# Patient Record
Sex: Female | Born: 1995 | Race: White | Hispanic: No | Marital: Single | Smoking: Never smoker
Health system: Southern US, Community
[De-identification: ages and names within clinical notes are randomized; demographics above are authoritative.]

## PROBLEM LIST (undated history)

## (undated) DIAGNOSIS — S0232XA Fracture of orbital floor, left side, initial encounter for closed fracture: Secondary | ICD-10-CM

## (undated) DIAGNOSIS — R001 Bradycardia, unspecified: Secondary | ICD-10-CM

## (undated) DIAGNOSIS — L709 Acne, unspecified: Secondary | ICD-10-CM

## (undated) DIAGNOSIS — R42 Dizziness and giddiness: Principal | ICD-10-CM

## (undated) DIAGNOSIS — S022XXA Fracture of nasal bones, initial encounter for closed fracture: Secondary | ICD-10-CM

## (undated) DIAGNOSIS — F909 Attention-deficit hyperactivity disorder, unspecified type: Secondary | ICD-10-CM

## (undated) HISTORY — DX: Dizziness and giddiness: R42

## (undated) HISTORY — DX: Bradycardia, unspecified: R00.1

---
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
## 2013-08-26 DIAGNOSIS — S022XXA Fracture of nasal bones, initial encounter for closed fracture: Secondary | ICD-10-CM

## 2013-08-26 DIAGNOSIS — S0232XA Fracture of orbital floor, left side, initial encounter for closed fracture: Secondary | ICD-10-CM

## 2013-08-26 HISTORY — DX: Fracture of orbital floor, left side, initial encounter for closed fracture: S02.32XA

## 2013-08-26 HISTORY — DX: Fracture of nasal bones, initial encounter for closed fracture: S02.2XXA

## 2013-08-27 ENCOUNTER — Emergency Department (HOSPITAL_COMMUNITY): Payer: Managed Care, Other (non HMO)

## 2013-08-27 ENCOUNTER — Emergency Department (HOSPITAL_COMMUNITY)
Admission: EM | Admit: 2013-08-27 | Discharge: 2013-08-27 | Disposition: A | Discharge status: Home / Self Care | Payer: Managed Care, Other (non HMO) | Attending: Emergency Medicine | Admitting: Emergency Medicine

## 2013-08-27 ENCOUNTER — Encounter (HOSPITAL_COMMUNITY): Payer: Self-pay | Admitting: Emergency Medicine

## 2013-08-27 DIAGNOSIS — S060XAA Concussion with loss of consciousness status unknown, initial encounter: Secondary | ICD-10-CM

## 2013-08-27 DIAGNOSIS — S0230XA Fracture of orbital floor, unspecified side, initial encounter for closed fracture: Secondary | ICD-10-CM | POA: Insufficient documentation

## 2013-08-27 DIAGNOSIS — Y92410 Unspecified street and highway as the place of occurrence of the external cause: Secondary | ICD-10-CM | POA: Insufficient documentation

## 2013-08-27 DIAGNOSIS — Y9389 Activity, other specified: Secondary | ICD-10-CM | POA: Diagnosis not present

## 2013-08-27 DIAGNOSIS — IMO0002 Reserved for concepts with insufficient information to code with codable children: Secondary | ICD-10-CM | POA: Insufficient documentation

## 2013-08-27 DIAGNOSIS — Z79899 Other long term (current) drug therapy: Secondary | ICD-10-CM | POA: Diagnosis not present

## 2013-08-27 DIAGNOSIS — Z791 Long term (current) use of non-steroidal anti-inflammatories (NSAID): Secondary | ICD-10-CM | POA: Insufficient documentation

## 2013-08-27 DIAGNOSIS — S060X0A Concussion without loss of consciousness, initial encounter: Secondary | ICD-10-CM | POA: Diagnosis not present

## 2013-08-27 DIAGNOSIS — Z3202 Encounter for pregnancy test, result negative: Secondary | ICD-10-CM | POA: Diagnosis not present

## 2013-08-27 DIAGNOSIS — S022XXA Fracture of nasal bones, initial encounter for closed fracture: Secondary | ICD-10-CM

## 2013-08-27 DIAGNOSIS — Z792 Long term (current) use of antibiotics: Secondary | ICD-10-CM | POA: Diagnosis not present

## 2013-08-27 DIAGNOSIS — S0232XA Fracture of orbital floor, left side, initial encounter for closed fracture: Secondary | ICD-10-CM

## 2013-08-27 DIAGNOSIS — S060X9A Concussion with loss of consciousness of unspecified duration, initial encounter: Secondary | ICD-10-CM

## 2013-08-27 DIAGNOSIS — S0990XA Unspecified injury of head, initial encounter: Secondary | ICD-10-CM | POA: Diagnosis present

## 2013-08-27 LAB — POC URINE PREG, ED: PREG TEST UR: NEGATIVE

## 2013-08-27 MED ORDER — IBUPROFEN 200 MG PO TABS
600.0000 mg | ORAL_TABLET | Freq: Once | ORAL | Status: AC
  Administered 2013-08-27: 600 mg via ORAL
  Filled 2013-08-27: qty 3

## 2013-08-27 MED ORDER — HYDROCODONE-ACETAMINOPHEN 5-325 MG PO TABS
2.0000 | ORAL_TABLET | Freq: Once | ORAL | Status: AC
  Administered 2013-08-27: 2 via ORAL
  Filled 2013-08-27: qty 2

## 2013-08-27 MED ORDER — CEFTRIAXONE SODIUM 1 G IJ SOLR
500.0000 mg | Freq: Once | INTRAMUSCULAR | Status: AC
  Administered 2013-08-27: 500 mg via INTRAMUSCULAR
  Filled 2013-08-27: qty 10

## 2013-08-27 MED ORDER — HYDROMORPHONE HCL PF 1 MG/ML IJ SOLN
1.0000 mg | Freq: Once | INTRAMUSCULAR | Status: AC
Start: 2013-08-27 — End: 2013-08-27
  Administered 2013-08-27: 1 mg via INTRAMUSCULAR
  Filled 2013-08-27: qty 1

## 2013-08-27 MED ORDER — CEPHALEXIN 500 MG PO CAPS: 500.0000 mg | ORAL_CAPSULE | Freq: Four times a day (QID) | ORAL | Status: DC

## 2013-08-27 MED ORDER — DEXAMETHASONE SODIUM PHOSPHATE 10 MG/ML IJ SOLN
10.0000 mg | Freq: Once | INTRAMUSCULAR | Status: AC
  Administered 2013-08-27: 10 mg via INTRAMUSCULAR
  Filled 2013-08-27: qty 1

## 2013-08-27 MED ORDER — IBUPROFEN 600 MG PO TABS: 600.0000 mg | ORAL_TABLET | Freq: Four times a day (QID) | ORAL | Status: DC | PRN

## 2013-08-27 MED ORDER — HYDROCODONE-ACETAMINOPHEN 5-325 MG PO TABS: 1.0000 | ORAL_TABLET | Freq: Four times a day (QID) | ORAL | Status: DC | PRN

## 2013-08-27 MED ORDER — METHYLPREDNISOLONE (PAK) 4 MG PO TABS: ORAL_TABLET | ORAL | Status: DC
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
## 2013-09-04 ENCOUNTER — Encounter (HOSPITAL_BASED_OUTPATIENT_CLINIC_OR_DEPARTMENT_OTHER): Payer: Self-pay | Admitting: *Deleted

## 2013-09-07 ENCOUNTER — Other Ambulatory Visit (HOSPITAL_BASED_OUTPATIENT_CLINIC_OR_DEPARTMENT_OTHER): Payer: Self-pay | Admitting: Otolaryngology
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
## 2013-09-10 ENCOUNTER — Encounter (HOSPITAL_BASED_OUTPATIENT_CLINIC_OR_DEPARTMENT_OTHER): Payer: Self-pay | Admitting: *Deleted

## 2013-09-10 ENCOUNTER — Encounter (HOSPITAL_BASED_OUTPATIENT_CLINIC_OR_DEPARTMENT_OTHER): Payer: Managed Care, Other (non HMO) | Admitting: Anesthesiology

## 2013-09-10 ENCOUNTER — Ambulatory Visit (HOSPITAL_BASED_OUTPATIENT_CLINIC_OR_DEPARTMENT_OTHER)
Admission: RE | Admit: 2013-09-10 | Discharge: 2013-09-11 | Disposition: A | Discharge status: Home / Self Care | Payer: Managed Care, Other (non HMO) | Source: Ambulatory Visit | Attending: Otolaryngology | Admitting: Otolaryngology

## 2013-09-10 ENCOUNTER — Ambulatory Visit (HOSPITAL_BASED_OUTPATIENT_CLINIC_OR_DEPARTMENT_OTHER): Payer: Managed Care, Other (non HMO) | Admitting: Anesthesiology

## 2013-09-10 ENCOUNTER — Encounter (HOSPITAL_BASED_OUTPATIENT_CLINIC_OR_DEPARTMENT_OTHER)
Admission: RE | Disposition: A | Discharge status: Home / Self Care | Payer: Self-pay | Source: Ambulatory Visit | Attending: Otolaryngology

## 2013-09-10 DIAGNOSIS — F909 Attention-deficit hyperactivity disorder, unspecified type: Secondary | ICD-10-CM | POA: Insufficient documentation

## 2013-09-10 DIAGNOSIS — S0230XA Fracture of orbital floor, unspecified side, initial encounter for closed fracture: Secondary | ICD-10-CM | POA: Diagnosis present

## 2013-09-10 DIAGNOSIS — S022XXA Fracture of nasal bones, initial encounter for closed fracture: Secondary | ICD-10-CM | POA: Insufficient documentation

## 2013-09-10 DIAGNOSIS — Y92410 Unspecified street and highway as the place of occurrence of the external cause: Secondary | ICD-10-CM | POA: Insufficient documentation

## 2013-09-10 HISTORY — PX: ORIF ORBITAL FRACTURE: SHX5312

## 2013-09-10 HISTORY — DX: Acne, unspecified: L70.9

## 2013-09-10 HISTORY — DX: Fracture of nasal bones, initial encounter for closed fracture: S02.2XXA

## 2013-09-10 HISTORY — DX: Fracture of orbital floor, left side, initial encounter for closed fracture: S02.32XA

## 2013-09-10 HISTORY — PX: CLOSED REDUCTION NASAL FRACTURE: SHX5365

## 2013-09-10 HISTORY — DX: Attention-deficit hyperactivity disorder, unspecified type: F90.9

## 2013-09-10 SURGERY — OPEN REDUCTION INTERNAL FIXATION (ORIF) ORBITAL FRACTURE
Anesthesia: General | Site: Nose

## 2013-09-10 MED ORDER — SUCCINYLCHOLINE CHLORIDE 20 MG/ML IJ SOLN
INTRAMUSCULAR | Status: AC
  Filled 2013-09-10: qty 1

## 2013-09-10 MED ORDER — PROPOFOL 10 MG/ML IV BOLUS
INTRAVENOUS | Status: DC | PRN
  Administered 2013-09-10: 200 mg via INTRAVENOUS

## 2013-09-10 MED ORDER — OXYCODONE HCL 5 MG/5ML PO SOLN: 5.0000 mg | Freq: Once | ORAL | Status: AC | PRN

## 2013-09-10 MED ORDER — HYDROMORPHONE HCL PF 1 MG/ML IJ SOLN
INTRAMUSCULAR | Status: AC
  Filled 2013-09-10: qty 1

## 2013-09-10 MED ORDER — MORPHINE SULFATE 2 MG/ML IJ SOLN
2.0000 mg | INTRAMUSCULAR | Status: DC | PRN
  Administered 2013-09-10: 4 mg via INTRAVENOUS
  Administered 2013-09-10: 2 mg via INTRAVENOUS
  Administered 2013-09-10 (×2): 4 mg via INTRAVENOUS
  Administered 2013-09-11: 2 mg via INTRAVENOUS
  Filled 2013-09-10 (×4): qty 2

## 2013-09-10 MED ORDER — CIPROFLOXACIN-DEXAMETHASONE 0.3-0.1 % OT SUSP
OTIC | Status: AC
  Filled 2013-09-10: qty 7.5

## 2013-09-10 MED ORDER — NEOMYCIN-POLYMYXIN-DEXAMETH 3.5-10000-0.1 OP OINT
TOPICAL_OINTMENT | Freq: Four times a day (QID) | OPHTHALMIC | Status: DC
  Administered 2013-09-10 – 2013-09-11 (×3): via OPHTHALMIC

## 2013-09-10 MED ORDER — HYDROCODONE-ACETAMINOPHEN 5-325 MG PO TABS
1.0000 | ORAL_TABLET | Freq: Four times a day (QID) | ORAL | Status: DC | PRN
  Filled 2013-09-10 (×3): qty 2

## 2013-09-10 MED ORDER — ONDANSETRON HCL 4 MG/2ML IJ SOLN
4.0000 mg | Freq: Once | INTRAMUSCULAR | Status: AC | PRN
  Filled 2013-09-10: qty 2

## 2013-09-10 MED ORDER — OXYMETAZOLINE HCL 0.05 % NA SOLN
NASAL | Status: AC
  Filled 2013-09-10: qty 15

## 2013-09-10 MED ORDER — DEXTROSE-NACL 5-0.45 % IV SOLN
INTRAVENOUS | Status: DC
  Administered 2013-09-10 – 2013-09-11 (×3): via INTRAVENOUS

## 2013-09-10 MED ORDER — MIDAZOLAM HCL 5 MG/5ML IJ SOLN
INTRAMUSCULAR | Status: DC | PRN
  Administered 2013-09-10: 2 mg via INTRAVENOUS

## 2013-09-10 MED ORDER — PROMETHAZINE HCL 25 MG RE SUPP
25.0000 mg | Freq: Four times a day (QID) | RECTAL | Status: DC | PRN
Start: 2013-09-10 — End: 2013-09-11
  Administered 2013-09-10: 25 mg via RECTAL
  Filled 2013-09-10: qty 1

## 2013-09-10 MED ORDER — FENTANYL CITRATE 0.05 MG/ML IJ SOLN
INTRAMUSCULAR | Status: AC
Start: 2013-09-10 — End: 2013-09-10
  Filled 2013-09-10: qty 6

## 2013-09-10 MED ORDER — NEOMYCIN-POLYMYXIN-DEXAMETH 3.5-10000-0.1 OP OINT
TOPICAL_OINTMENT | OPHTHALMIC | Status: DC | PRN
  Administered 2013-09-10: 1 via OPHTHALMIC

## 2013-09-10 MED ORDER — HYDROCORTISONE NA SUCCINATE PF 100 MG IJ SOLR
100.0000 mg | Freq: Once | INTRAMUSCULAR | Status: AC
  Administered 2013-09-10: 1 g via INTRAVENOUS

## 2013-09-10 MED ORDER — HYDROCODONE-ACETAMINOPHEN 5-325 MG PO TABS
1.0000 | ORAL_TABLET | ORAL | Status: DC | PRN
  Administered 2013-09-10 – 2013-09-11 (×5): 2 via ORAL
  Filled 2013-09-10 (×2): qty 2

## 2013-09-10 MED ORDER — LIDOCAINE-EPINEPHRINE 1 %-1:100000 IJ SOLN
INTRAMUSCULAR | Status: DC | PRN
  Administered 2013-09-10: 7 mL

## 2013-09-10 MED ORDER — ONDANSETRON HCL 4 MG PO TABS: 4.0000 mg | ORAL_TABLET | ORAL | Status: DC | PRN

## 2013-09-10 MED ORDER — PROPOFOL 10 MG/ML IV BOLUS
INTRAVENOUS | Status: AC
  Filled 2013-09-10: qty 20

## 2013-09-10 MED ORDER — AMPHETAMINE-DEXTROAMPHETAMINE 10 MG PO TABS: 10.0000 mg | ORAL_TABLET | Freq: Every day | ORAL | Status: DC

## 2013-09-10 MED ORDER — CEFAZOLIN SODIUM-DEXTROSE 2-3 GM-% IV SOLR
INTRAVENOUS | Status: AC
  Filled 2013-09-10: qty 50

## 2013-09-10 MED ORDER — ONDANSETRON HCL 4 MG/2ML IJ SOLN
INTRAMUSCULAR | Status: DC | PRN
  Administered 2013-09-10: 4 mg via INTRAVENOUS

## 2013-09-10 MED ORDER — MIDAZOLAM HCL 2 MG/2ML IJ SOLN
INTRAMUSCULAR | Status: AC
  Filled 2013-09-10: qty 2

## 2013-09-10 MED ORDER — LIDOCAINE HCL (CARDIAC) 20 MG/ML IV SOLN
INTRAVENOUS | Status: DC | PRN
  Administered 2013-09-10: 50 mg via INTRAVENOUS

## 2013-09-10 MED ORDER — BSS IO SOLN
INTRAOCULAR | Status: DC | PRN
  Administered 2013-09-10: 15 mL via INTRAOCULAR

## 2013-09-10 MED ORDER — IBUPROFEN 400 MG PO TABS
400.0000 mg | ORAL_TABLET | Freq: Four times a day (QID) | ORAL | Status: DC | PRN
  Administered 2013-09-10 – 2013-09-11 (×2): 400 mg via ORAL
  Filled 2013-09-10 (×2): qty 2

## 2013-09-10 MED ORDER — MIDAZOLAM HCL 2 MG/2ML IJ SOLN: 1.0000 mg | INTRAMUSCULAR | Status: DC | PRN

## 2013-09-10 MED ORDER — LIDOCAINE-EPINEPHRINE 1 %-1:100000 IJ SOLN
INTRAMUSCULAR | Status: AC
  Filled 2013-09-10: qty 1

## 2013-09-10 MED ORDER — FENTANYL CITRATE 0.05 MG/ML IJ SOLN
INTRAMUSCULAR | Status: DC | PRN
  Administered 2013-09-10 (×2): 25 ug via INTRAVENOUS
  Administered 2013-09-10: 50 ug via INTRAVENOUS
  Administered 2013-09-10: 100 ug via INTRAVENOUS

## 2013-09-10 MED ORDER — CEFAZOLIN SODIUM-DEXTROSE 2-3 GM-% IV SOLR
2.0000 g | INTRAVENOUS | Status: AC
  Administered 2013-09-10: 2 g via INTRAVENOUS

## 2013-09-10 MED ORDER — FENTANYL CITRATE 0.05 MG/ML IJ SOLN: 50.0000 ug | INTRAMUSCULAR | Status: DC | PRN

## 2013-09-10 MED ORDER — PROMETHAZINE HCL 25 MG/ML IJ SOLN: 25.0000 mg | Freq: Four times a day (QID) | INTRAMUSCULAR | Status: DC | PRN

## 2013-09-10 MED ORDER — CEPHALEXIN 500 MG PO CAPS
500.0000 mg | ORAL_CAPSULE | Freq: Four times a day (QID) | ORAL | Status: DC
  Administered 2013-09-10 – 2013-09-11 (×4): 500 mg via ORAL

## 2013-09-10 MED ORDER — ONDANSETRON HCL 4 MG/2ML IJ SOLN
4.0000 mg | INTRAMUSCULAR | Status: DC | PRN
Start: 2013-09-10 — End: 2013-09-11
  Administered 2013-09-10: 4 mg via INTRAVENOUS

## 2013-09-10 MED ORDER — MIDAZOLAM HCL 2 MG/ML PO SYRP
12.0000 mg | ORAL_SOLUTION | Freq: Once | ORAL | Status: DC | PRN
Start: 2013-09-10 — End: 2013-09-10

## 2013-09-10 MED ORDER — LACTATED RINGERS IV SOLN
INTRAVENOUS | Status: DC
  Administered 2013-09-10 (×2): via INTRAVENOUS

## 2013-09-10 MED ORDER — NEOMYCIN-POLYMYXIN-DEXAMETH 3.5-10000-0.1 OP OINT
TOPICAL_OINTMENT | OPHTHALMIC | Status: AC
  Filled 2013-09-10: qty 3.5

## 2013-09-10 MED ORDER — OXYCODONE HCL 5 MG PO TABS: 5.0000 mg | ORAL_TABLET | Freq: Once | ORAL | Status: AC | PRN

## 2013-09-10 MED ORDER — BACITRACIN-NEOMYCIN-POLYMYXIN 400-5-5000 EX OINT
TOPICAL_OINTMENT | CUTANEOUS | Status: AC
  Filled 2013-09-10: qty 1

## 2013-09-10 MED ORDER — SUCCINYLCHOLINE CHLORIDE 20 MG/ML IJ SOLN
INTRAMUSCULAR | Status: DC | PRN
  Administered 2013-09-10: 100 mg via INTRAVENOUS

## 2013-09-10 MED ORDER — BSS IO SOLN
INTRAOCULAR | Status: AC
  Filled 2013-09-10: qty 15

## 2013-09-10 MED ORDER — HYDROMORPHONE HCL PF 1 MG/ML IJ SOLN
0.2500 mg | INTRAMUSCULAR | Status: DC | PRN
  Administered 2013-09-10 (×3): 0.5 mg via INTRAVENOUS

## 2013-09-10 MED ORDER — OXYMETAZOLINE HCL 0.05 % NA SOLN
2.0000 | NASAL | Status: AC
  Administered 2013-09-10 (×2): 2 via NASAL

## 2013-09-10 SURGICAL SUPPLY — 74 items
APPLICATOR COTTON TIP 6IN STRL (MISCELLANEOUS) IMPLANT
APPLICATOR DR MATTHEWS STRL (MISCELLANEOUS) IMPLANT
BIT DRILL UPPR FCE 1.0M 6 TWST (DRILL) ×2 IMPLANT
CANISTER SUCT 1200ML W/VALVE (MISCELLANEOUS) ×4 IMPLANT
CLEANER CAUTERY TIP 5X5 PAD (MISCELLANEOUS) IMPLANT
CONT SPEC 4OZ CLIKSEAL STRL BL (MISCELLANEOUS) IMPLANT
CONT SPECI 4OZ STER CLIK (MISCELLANEOUS) ×4 IMPLANT
CORDS BIPOLAR (ELECTRODE) ×4 IMPLANT
COTTONBALL LRG STERILE PKG (GAUZE/BANDAGES/DRESSINGS) IMPLANT
DECANTER SPIKE VIAL GLASS SM (MISCELLANEOUS) IMPLANT
DEPRESSOR TONGUE BLADE STERILE (MISCELLANEOUS) IMPLANT
DRESSING NASAL POPE 10X1.5X2.5 (GAUZE/BANDAGES/DRESSINGS) IMPLANT
DRILL UPPERFACE 1.0M 6MM TWIST (DRILL) ×4
DRSG NASAL KENNEDY LMNT 8CM (GAUZE/BANDAGES/DRESSINGS) IMPLANT
DRSG NASAL POPE 10X1.5X2.5 (GAUZE/BANDAGES/DRESSINGS)
DRSG TELFA 3X8 NADH (GAUZE/BANDAGES/DRESSINGS) IMPLANT
ELECT NEEDLE BLADE 2-5/6 (NEEDLE) IMPLANT
ELECT REM PT RETURN 9FT ADLT (ELECTROSURGICAL) ×4
ELECTRODE REM PT RTRN 9FT ADLT (ELECTROSURGICAL) ×2 IMPLANT
GAUZE PACKING FOLDED 2  STR (GAUZE/BANDAGES/DRESSINGS) ×2
GAUZE PACKING FOLDED 2 STR (GAUZE/BANDAGES/DRESSINGS) ×2 IMPLANT
GAUZE SPONGE 4X4 16PLY XRAY LF (GAUZE/BANDAGES/DRESSINGS) IMPLANT
GLOVE ECLIPSE 6.5 STRL STRAW (GLOVE) ×4 IMPLANT
GLOVE ECLIPSE 8.0 STRL XLNG CF (GLOVE) ×4 IMPLANT
GLOVE SURG SS PI 7.0 STRL IVOR (GLOVE) ×4 IMPLANT
GOWN STRL REUS W/ TWL LRG LVL3 (GOWN DISPOSABLE) ×2 IMPLANT
GOWN STRL REUS W/TWL LRG LVL3 (GOWN DISPOSABLE) ×2
IMPL MEDPOR MTB LEFT 41X42 (Mesh General) ×2 IMPLANT
IMPLANT MEDPOR MTB LEFT 41X42 (Mesh General) ×4 IMPLANT
KIT SPLINT NASAL DENVER LRG BE (GAUZE/BANDAGES/DRESSINGS) IMPLANT
KIT SPLINT NASAL DENVER MIN BE (GAUZE/BANDAGES/DRESSINGS) IMPLANT
KIT SPLINT NASAL DENVER PET BE (GAUZE/BANDAGES/DRESSINGS) ×4 IMPLANT
KIT SPLINT NASAL DENVER SM BEI (GAUZE/BANDAGES/DRESSINGS) IMPLANT
MARKER SKIN DUAL TIP RULER LAB (MISCELLANEOUS) IMPLANT
NDL SAFETY ECLIPSE 18X1.5 (NEEDLE) IMPLANT
NEEDLE BLUNT 17GA (NEEDLE) IMPLANT
NEEDLE HYPO 18GX1.5 SHARP (NEEDLE)
NEEDLE HYPO 22GX1.5 SAFETY (NEEDLE) IMPLANT
NEEDLE HYPO 25X1 1.5 SAFETY (NEEDLE) ×4 IMPLANT
NS IRRIG 1000ML POUR BTL (IV SOLUTION) ×4 IMPLANT
PACK BASIN DAY SURGERY FS (CUSTOM PROCEDURE TRAY) ×4 IMPLANT
PACK ENT DAY SURGERY (CUSTOM PROCEDURE TRAY) ×4 IMPLANT
PAD CLEANER CAUTERY TIP 5X5 (MISCELLANEOUS)
PATTIES SURGICAL .5 X3 (DISPOSABLE) ×4 IMPLANT
PENCIL BUTTON HOLSTER BLD 10FT (ELECTRODE) ×4 IMPLANT
SCREW UPPERFACE 1.2X5M SL (Screw) ×8 IMPLANT
SHEET MEDIUM DRAPE 40X70 STRL (DRAPES) ×4 IMPLANT
SHEILD EYE MED CORNL SHD 22X21 (OPHTHALMIC RELATED) ×4
SHIELD EYE MED CORNL SHD 22X21 (OPHTHALMIC RELATED) ×2 IMPLANT
SPONGE GAUZE 2X2 8PLY STER LF (GAUZE/BANDAGES/DRESSINGS)
SPONGE GAUZE 2X2 8PLY STRL LF (GAUZE/BANDAGES/DRESSINGS) IMPLANT
SPONGE GAUZE 4X4 12PLY STER LF (GAUZE/BANDAGES/DRESSINGS) ×4 IMPLANT
SPONGE NEURO XRAY DETECT 1X3 (DISPOSABLE) IMPLANT
SURGILUBE 2OZ TUBE FLIPTOP (MISCELLANEOUS) IMPLANT
SUT CHROMIC 3 0 PS 2 (SUTURE) IMPLANT
SUT CHROMIC 4 0 P 3 18 (SUTURE) IMPLANT
SUT CHROMIC 4 0 PS 2 18 (SUTURE) IMPLANT
SUT ETHILON 4 0 CL P 3 (SUTURE) IMPLANT
SUT ETHILON 4 0 P 3 18 (SUTURE) IMPLANT
SUT ETHILON 5 0 CL P 3 (SUTURE) IMPLANT
SUT ETHILON 6 0 P 1 (SUTURE) ×4 IMPLANT
SUT PLAIN 6 0 TG1408 (SUTURE) ×4 IMPLANT
SUT SILK 2 0 FS (SUTURE) IMPLANT
SUT SILK 4 0 C 3 735G (SUTURE) IMPLANT
SYR 3ML 23GX1 SAFETY (SYRINGE) IMPLANT
SYR 5ML LL (SYRINGE) IMPLANT
SYR BULB 3OZ (MISCELLANEOUS) IMPLANT
SYR CONTROL 10ML LL (SYRINGE) IMPLANT
TOWEL OR 17X24 6PK STRL BLUE (TOWEL DISPOSABLE) ×4 IMPLANT
TOWEL OR NON WOVEN STRL DISP B (DISPOSABLE) ×4 IMPLANT
TRAY DSU PREP LF (CUSTOM PROCEDURE TRAY) ×4 IMPLANT
TUBE CONNECTING 20'X1/4 (TUBING) ×1
TUBE CONNECTING 20X1/4 (TUBING) ×3 IMPLANT
YANKAUER SUCT BULB TIP NO VENT (SUCTIONS) ×4 IMPLANT

## 2013-09-10 NOTE — Anesthesia Procedure Notes (Signed)
Procedure Name: Intubation Date/Time: 09/10/2013 7:49 AM Performed by: Melynda Ripple D Pre-anesthesia Checklist: Patient identified, Emergency Drugs available, Suction available and Patient being monitored Patient Re-evaluated:Patient Re-evaluated prior to inductionOxygen Delivery Method: Circle System Utilized Preoxygenation: Pre-oxygenation with 100% oxygen Intubation Type: IV induction Ventilation: Mask ventilation without difficulty Laryngoscope Size: Mac and 3 Grade View: Grade I Tube type: Oral Number of attempts: 1 Airway Equipment and Method: stylet and oral airway Placement Confirmation: ETT inserted through vocal cords under direct vision,  positive ETCO2 and breath sounds checked- equal and bilateral Secured at: 22 cm Tube secured with: Tape Dental Injury: Teeth and Oropharynx as per pre-operative assessment

## 2013-09-10 NOTE — Transfer of Care (Signed)
Immediate Anesthesia Transfer of Care Note  Patient: Suzanne Greene  Procedure(s) Performed: Procedure(s) with comments: OPEN REDUCTION INTERNAL FIXATION (ORIF) LEFT ORBITAL FRACTURE (Left) - wound class 2 CLOSED REDUCTION NASAL FRACTURE WITH STABILIZATION (N/A) - wound class  2  Patient Location: PACU  Anesthesia Type:General  Level of Consciousness: awake, alert  and oriented  Airway & Oxygen Therapy: Patient Spontanous Breathing and Patient connected to face mask oxygen  Post-op Assessment: Report given to PACU RN and Post -op Vital signs reviewed and stable  Post vital signs: Reviewed and stable  Complications: No apparent anesthesia complications
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
## 2013-09-10 NOTE — Anesthesia Postprocedure Evaluation (Signed)
Anesthesia Post Note  Patient: Suzanne Greene  Procedure(s) Performed: Procedure(s) (LRB): OPEN REDUCTION INTERNAL FIXATION (ORIF) LEFT ORBITAL FRACTURE (Left) CLOSED REDUCTION NASAL FRACTURE WITH STABILIZATION (N/A)  Anesthesia type: General  Patient location: PACU  Post pain: Pain level controlled and Adequate analgesia  Post assessment: Post-op Vital signs reviewed, Patient's Cardiovascular Status Stable, Respiratory Function Stable, Patent Airway and Pain level controlled  Last Vitals:  Filed Vitals:   09/10/13 1045  BP: 126/74  Pulse: 51  Temp:   Resp: 14    Post vital signs: Reviewed and stable  Level of consciousness: awake, alert  and oriented  Complications: No apparent anesthesia complications

## 2013-09-10 NOTE — Anesthesia Preprocedure Evaluation (Signed)
Anesthesia Evaluation  Patient identified by MRN, date of birth, ID band Patient awake    Reviewed: Allergy & Precautions, H&P , NPO status , Patient's Chart, lab work & pertinent test results  Airway Mallampati: I TM Distance: >3 FB Neck ROM: Full    Dental  (+) Teeth Intact, Dental Advisory Given   Pulmonary  breath sounds clear to auscultation        Cardiovascular Rhythm:Regular Rate:Normal     Neuro/Psych    GI/Hepatic   Endo/Other    Renal/GU      Musculoskeletal   Abdominal   Peds  Hematology   Anesthesia Other Findings   Reproductive/Obstetrics                           Anesthesia Physical Anesthesia Plan  ASA: I  Anesthesia Plan: General   Post-op Pain Management:    Induction: Intravenous  Airway Management Planned: Oral ETT  Additional Equipment:   Intra-op Plan:   Post-operative Plan: Extubation in OR  Informed Consent: I have reviewed the patients History and Physical, chart, labs and discussed the procedure including the risks, benefits and alternatives for the proposed anesthesia with the patient or authorized representative who has indicated his/her understanding and acceptance.   Dental advisory given  Plan Discussed with: CRNA, Anesthesiologist and Surgeon  Anesthesia Plan Comments:         Anesthesia Quick Evaluation  

## 2013-09-10 NOTE — Discharge Instructions (Signed)
Keep head elevated x 3-4 nights No nose blowing x 2 weeks Keep nasal splint dry.  If splint comes off before 7 days, tape it back on day and night.  After 7 days, at night only. Apply eye ointment into LEFT eye 3 times daily for 7 days Ice pack is optional after 24 hrs. Oral antibiotics and pain medication as prescribed. Call for any vision problems, signs of infection, excessive pain. ([phone], Dr. Erik Obey) No strenuous activity x 2 weeks. Recheck my office 10 days, [phone] for an appointment.
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
## 2013-09-10 NOTE — Interval H&P Note (Signed)
Interval evaluation completed.

## 2013-09-11 NOTE — Discharge Summary (Signed)
09/11/2013 9:22 AM  Jory Sims [id number]  Post-Op Day 1    Temp:  [96.4 F (35.8 C)-100.2 F (37.9 C)] 96.4 F (35.8 C) (05/05 0600) Pulse Rate:  [42-76] 42 (05/05 0800) Resp:  [6-18] 6 (05/05 0800) BP: (110-150)/(53-79) 111/54 mmHg (05/05 0600) SpO2:  [90 %-100 %] 99 % (05/05 0800),     Intake/Output Summary (Last 24 hours) at 09/11/13 0254 Last data filed at 09/11/13 0700  Gross per 24 hour  Intake   2062 ml  Output    900 ml  Net   1162 ml    No results found for this or any previous visit (from the past 24 hour(s)).  SUBJECTIVE:  Pain controlled.  Finally control of nausea.  Tol po fluids.  OBJECTIVE:  No vision checks found on chart.  EPIC EHR problem.  Nurses recall good vision checks.  Mod LEFT periorbital edema.  Vision grossly intact with lids opened manually.  Nasal splint in good position.    IMPRESSION:  Satisfactory check  PLAN:  Discharge home.  Ice, elevation, antibiotics.  Admit:  4 MAY Discharge: 5 MAY Final diagnosis:  LEFT orbital blowout fracture.  Displaced nasal fracture. Proc:  ORIF LEFT orbital blowout fracture.  Closed reduction nasal fx with stabilization Comp:  None Cond:  Ambulatory.  Vision intact.  N,V controlled. Recheck: 10 days Rx:  Paper rx for Phenergan supp Instructions written and given  Hosp course:  Had surgery, did well afterward.  EPIC made successful discharge preparations impossible.  Paper prescriptions written.    Ileene Hutchinson [facility]

## 2013-09-12 ENCOUNTER — Encounter (HOSPITAL_BASED_OUTPATIENT_CLINIC_OR_DEPARTMENT_OTHER): Payer: Self-pay | Admitting: Otolaryngology

## 2013-09-27 ENCOUNTER — Other Ambulatory Visit: Payer: Self-pay | Admitting: Otolaryngology

## 2013-09-27 DIAGNOSIS — S0280XA Fracture of other specified skull and facial bones, unspecified side, initial encounter for closed fracture: Secondary | ICD-10-CM

## 2013-10-02 ENCOUNTER — Ambulatory Visit
Admission: RE | Admit: 2013-10-02 | Discharge: 2013-10-02 | Disposition: A | Discharge status: Home / Self Care | Payer: Managed Care, Other (non HMO) | Source: Ambulatory Visit | Attending: Otolaryngology | Admitting: Otolaryngology

## 2013-10-02 DIAGNOSIS — S0280XA Fracture of other specified skull and facial bones, unspecified side, initial encounter for closed fracture: Secondary | ICD-10-CM

## 2014-07-16 ENCOUNTER — Encounter: Payer: Self-pay | Admitting: Nurse Practitioner

## 2014-07-16 ENCOUNTER — Ambulatory Visit (INDEPENDENT_AMBULATORY_CARE_PROVIDER_SITE_OTHER): Payer: Managed Care, Other (non HMO) | Admitting: Nurse Practitioner

## 2014-07-16 VITALS — BP 100/64 | HR 60 | Ht 68.25 in | Wt 154.0 lb

## 2014-07-16 DIAGNOSIS — Z113 Encounter for screening for infections with a predominantly sexual mode of transmission: Secondary | ICD-10-CM

## 2014-07-16 DIAGNOSIS — Z01419 Encounter for gynecological examination (general) (routine) without abnormal findings: Secondary | ICD-10-CM

## 2014-07-16 DIAGNOSIS — Z0000 Encounter for general adult medical examination without abnormal findings: Secondary | ICD-10-CM

## 2014-07-16 LAB — POCT URINALYSIS DIPSTICK
BILIRUBIN UA: NEGATIVE
GLUCOSE UA: NEGATIVE
Ketones, UA: NEGATIVE
Leukocytes, UA: NEGATIVE
NITRITE UA: NEGATIVE
PH UA: 6
PROTEIN UA: NEGATIVE
RBC UA: NEGATIVE
Urobilinogen, UA: NEGATIVE

## 2014-07-16 MED ORDER — DROSPIRENONE-ETHINYL ESTRADIOL 3-0.02 MG PO TABS: 1.0000 | ORAL_TABLET | Freq: Every day | ORAL | Status: DC
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
## 2014-07-16 NOTE — Patient Instructions (Addendum)
General topics  Next pap or exam is  due in 1 year Take a Women's multivitamin Take 1200 mg. of calcium daily - prefer dietary If any concerns in interim to call back  Breast Self-Awareness Practicing breast self-awareness may pick up problems early, prevent significant medical complications, and possibly save your life. By practicing breast self-awareness, you can become familiar with how your breasts look and feel and if your breasts are changing. This allows you to notice changes early. It can also offer you some reassurance that your breast health is good. One way to learn what is normal for your breasts and whether your breasts are changing is to do a breast self-exam. If you find a lump or something that was not present in the past, it is best to contact your caregiver right away. Other findings that should be evaluated by your caregiver include nipple discharge, especially if it is bloody; skin changes or reddening; areas where the skin seems to be pulled in (retracted); or new lumps and bumps. Breast pain is seldom associated with cancer (malignancy), but should also be evaluated by a caregiver. BREAST SELF-EXAM The best time to examine your breasts is 5 7 days after your menstrual period is over.  ExitCare Patient Information 2013 [employer].   Exercise to Stay Healthy Exercise helps you become and stay healthy. EXERCISE IDEAS AND TIPS Choose exercises that:  You enjoy.  Fit into your day. You do not need to exercise really hard to be healthy. You can do exercises at a slow or medium level and stay healthy. You can:  Stretch before and after working out.  Try yoga, Pilates, or tai chi.  Lift weights.  Walk fast, swim, jog, run, climb stairs, bicycle, dance, or rollerskate.  Take aerobic classes. Exercises that burn about 150 calories:  Running 1  miles in 15 minutes.  Playing volleyball for 45 to 60 minutes.  Washing and waxing a car for 45 to 60  minutes.  Playing touch football for 45 minutes.  Walking 1  miles in 35 minutes.  Pushing a stroller 1  miles in 30 minutes.  Playing basketball for 30 minutes.  Raking leaves for 30 minutes.  Bicycling 5 miles in 30 minutes.  Walking 2 miles in 30 minutes.  Dancing for 30 minutes.  Shoveling snow for 15 minutes.  Swimming laps for 20 minutes.  Walking up stairs for 15 minutes.  Bicycling 4 miles in 15 minutes.  Gardening for 30 to 45 minutes.  Jumping rope for 15 minutes.  Washing windows or floors for 45 to 60 minutes. Document Released: 05/29/2010 Document Revised: 07/19/2011 Document Reviewed: 05/29/2010 ExitCare Patient Information 2013 [employer].   Other topics ( that may be useful information):    Sexually Transmitted Disease Sexually transmitted disease (STD) refers to any infection that is passed from person to person during sexual activity. This may happen by way of saliva, semen, blood, vaginal mucus, or urine. Common STDs include:  Gonorrhea.  Chlamydia.  Syphilis.  HIV/AIDS.  Genital herpes.  Hepatitis B and C.  Trichomonas.  Human papillomavirus (HPV).  Pubic lice. CAUSES  An STD may be spread by bacteria, virus, or parasite. A person can get an STD by:  Sexual intercourse with an infected person.  Sharing sex toys with an infected person.  Sharing needles with an infected person.  Having intimate contact with the genitals, mouth, or rectal areas of an infected person. SYMPTOMS  Some people may not have any symptoms, but   they can still pass the infection to others. Different STDs have different symptoms. Symptoms include:  Painful or bloody urination.  Pain in the pelvis, abdomen, vagina, anus, throat, or eyes.  Skin rash, itching, irritation, growths, or sores (lesions). These usually occur in the genital or anal area.  Abnormal vaginal discharge.  Penile discharge in men.  Soft, flesh-colored skin growths in the  genital or anal area.  Fever.  Pain or bleeding during sexual intercourse.  Swollen glands in the groin area.  Yellow skin and eyes (jaundice). This is seen with hepatitis. DIAGNOSIS  To make a diagnosis, your caregiver may:  Take a medical history.  Perform a physical exam.  Take a specimen (culture) to be examined.  Examine a sample of discharge under a microscope.  Perform blood test TREATMENT   Chlamydia, gonorrhea, trichomonas, and syphilis can be cured with antibiotic medicine.  Genital herpes, hepatitis, and HIV can be treated, but not cured, with prescribed medicines. The medicines will lessen the symptoms.  Genital warts from HPV can be treated with medicine or by freezing, burning (electrocautery), or surgery. Warts may come back.  HPV is a virus and cannot be cured with medicine or surgery.However, abnormal areas may be followed very closely by your caregiver and may be removed from the cervix, vagina, or vulva through office procedures or surgery. If your diagnosis is confirmed, your recent sexual partners need treatment. This is true even if they are symptom-free or have a negative culture or evaluation. They should not have sex until their caregiver says it is okay. HOME CARE INSTRUCTIONS  All sexual partners should be informed, tested, and treated for all STDs.  Take your antibiotics as directed. Finish them even if you start to feel better.  Only take over-the-counter or prescription medicines for pain, discomfort, or fever as directed by your caregiver.  Rest.  Eat a balanced diet and drink enough fluids to keep your urine clear or pale yellow.  Do not have sex until treatment is completed and you have followed up with your caregiver. STDs should be checked after treatment.  Keep all follow-up appointments, Pap tests, and blood tests as directed by your caregiver.  Only use latex condoms and water-soluble lubricants during sexual activity. Do not use  petroleum jelly or oils.  Avoid alcohol and illegal drugs.  Get vaccinated for HPV and hepatitis. If you have not received these vaccines in the past, talk to your caregiver about whether one or both might be right for you.  Avoid risky sex practices that can break the skin. The only way to avoid getting an STD is to avoid all sexual activity.Latex condoms and dental dams (for oral sex) will help lessen the risk of getting an STD, but will not completely eliminate the risk. SEEK MEDICAL CARE IF:   You have a fever.  You have any new or worsening symptoms. Document Released: 07/17/2002 Document Revised: 07/19/2011 Document Reviewed: 07/24/2010 [facility] Patient Information 2013 [employer].    Domestic Abuse You are being battered or abused if someone close to you hits, pushes, or physically hurts you in any way. You also are being abused if you are forced into activities. You are being sexually abused if you are forced to have sexual contact of any kind. You are being emotionally abused if you are made to feel worthless or if you are constantly threatened. It is important to remember that help is available. No one has the right to abuse you. PREVENTION OF FURTHER  ABUSE  Learn the warning signs of danger. This varies with situations but may include: the use of alcohol, threats, isolation from friends and family, or forced sexual contact. Leave if you feel that violence is going to occur.  If you are attacked or beaten, report it to the police so the abuse is documented. You do not have to press charges. The police can protect you while you or the attackers are leaving. Get the officer's name and badge number and a copy of the report.  Find someone you can trust and tell them what is happening to you: your caregiver, a nurse, clergy member, close friend or family member. Feeling ashamed is natural, but remember that you have done nothing wrong. No one deserves abuse. Document Released:  04/23/2000 Document Revised: 07/19/2011 Document Reviewed: 07/02/2010 ExitCare Patient Information 2013 [employer].    How Much is Too Much Alcohol? Drinking too much alcohol can cause injury, accidents, and health problems. These types of problems can include:   Car crashes.  Falls.  Family fighting (domestic violence).  Drowning.  Fights.  Injuries.  Burns.  Damage to certain organs.  Having a baby with birth defects. ONE DRINK CAN BE TOO MUCH WHEN YOU ARE:  Working.  Pregnant or breastfeeding.  Taking medicines. Ask your doctor.  Driving or planning to drive. If you or someone you know has a drinking problem, get help from a doctor.  Document Released: 02/20/2009 Document Revised: 07/19/2011 Document Reviewed: 02/20/2009 ExitCare Patient Information 2013 [employer].   Smoking Hazards Smoking cigarettes is extremely bad for your health. Tobacco smoke has over 200 known poisons in it. There are over 60 chemicals in tobacco smoke that cause cancer. Some of the chemicals found in cigarette smoke include:   Cyanide.  Benzene.  Formaldehyde.  Methanol (wood alcohol).  Acetylene (fuel used in welding torches).  Ammonia. Cigarette smoke also contains the poisonous gases nitrogen oxide and carbon monoxide.  Cigarette smokers have an increased risk of many serious medical problems and Smoking causes approximately:  90% of all lung cancer deaths in men.  80% of all lung cancer deaths in women.  90% of deaths from chronic obstructive lung disease. Compared with nonsmokers, smoking increases the risk of:  Coronary heart disease by 2 to 4 times.  Stroke by 2 to 4 times.  Men developing lung cancer by 23 times.  Women developing lung cancer by 13 times.  Dying from chronic obstructive lung diseases by 12 times.  . Smoking is the most preventable cause of death and disease in our society.  WHY IS SMOKING ADDICTIVE?  Nicotine is the chemical  agent in tobacco that is capable of causing addiction or dependence.  When you smoke and inhale, nicotine is absorbed rapidly into the bloodstream through your lungs. Nicotine absorbed through the lungs is capable of creating a powerful addiction. Both inhaled and non-inhaled nicotine may be addictive.  Addiction studies of cigarettes and spit tobacco show that addiction to nicotine occurs mainly during the teen years, when young people begin using tobacco products. WHAT ARE THE BENEFITS OF QUITTING?  There are many health benefits to quitting smoking.   Likelihood of developing cancer and heart disease decreases. Health improvements are seen almost immediately.  Blood pressure, pulse rate, and breathing patterns start returning to normal soon after quitting. QUITTING SMOKING   American Lung Association - 1-800-LUNGUSA  American Cancer Society - 1-800-ACS-2345 Document Released: 06/03/2004 Document Revised: 07/19/2011 Document Reviewed: 02/05/2009 ExitCare Patient Information 2013 ExitCare,   LL[employer].   Stress Management Stress is a state of physical or mental tension that often results from changes in your life or normal routine. Some common causes of stress are:  Death of a loved one.  Injuries or severe illnesses.  Getting fired or changing jobs.  Moving into a new home. Other causes may be:  Sexual problems.  Business or financial losses.  Taking on a large debt.  Regular conflict with someone at home or at work.  Constant tiredness from lack of sleep. It is not just bad things that are stressful. It may be stressful to:  Win the lottery.  Get married.  Buy a new car. The amount of stress that can be easily tolerated varies from person to person. Changes generally cause stress, regardless of the types of change. Too much stress can affect your health. It may lead to physical or emotional problems. Too little stress (boredom) may also become stressful. SUGGESTIONS TO  REDUCE STRESS:  Talk things over with your family and friends. It often is helpful to share your concerns and worries. If you feel your problem is serious, you may want to get help from a professional counselor.  Consider your problems one at a time instead of lumping them all together. Trying to take care of everything at once may seem impossible. List all the things you need to do and then start with the most important one. Set a goal to accomplish 2 or 3 things each day. If you expect to do too many in a single day you will naturally fail, causing you to feel even more stressed.  Do not use alcohol or drugs to relieve stress. Although you may feel better for a short time, they do not remove the problems that caused the stress. They can also be habit forming.  Exercise regularly - at least 3 times per week. Physical exercise can help to relieve that "uptight" feeling and will relax you.  The shortest distance between despair and hope is often a good night's sleep.  Go to bed and get up on time allowing yourself time for appointments without being rushed.  Take a short "time-out" period from any stressful situation that occurs during the day. Close your eyes and take some deep breaths. Starting with the muscles in your face, tense them, hold it for a few seconds, then relax. Repeat this with the muscles in your neck, shoulders, hand, stomach, back and legs.  Take good care of yourself. Eat a balanced diet and get plenty of rest.  Schedule time for having fun. Take a break from your daily routine to relax. HOME CARE INSTRUCTIONS   Call if you feel overwhelmed by your problems and feel you can no longer manage them on your own.  Return immediately if you feel like hurting yourself or someone else. Document Released: 10/20/2000 Document Revised: 07/19/2011 Document Reviewed: 06/12/2007 ExitCare Patient Information 2013 ExitCare[employer].  Oral Contraception Information Oral contraceptive  pills (OCPs) are medicines taken to prevent pregnancy. OCPs work by preventing the ovaries from releasing eggs. The hormones in OCPs also cause the cervical mucus to thicken, preventing the sperm from entering the uterus. The hormones also cause the uterine lining to become thin, not allowing a fertilized egg to attach to the inside of the uterus. OCPs are highly effective when taken exactly as prescribed. However, OCPs do not prevent sexually transmitted diseases (STDs). Safe sex practices, such as using condoms along with the pill, can help prevent STDs.    Before taking the pill, you may have a physical exam and Pap test. Your health care provider may order blood tests. The health care provider will make sure you are a good candidate for oral contraception. Discuss with your health care provider the possible side effects of the OCP you may be prescribed. When starting an OCP, it can take 2 to 3 months for the body to adjust to the changes in hormone levels in your body.  TYPES OF ORAL CONTRACEPTION  The combination pill--This pill contains estrogen and progestin (synthetic progesterone) hormones. The combination pill comes in 21-day, 28-day, or 91-day packs. Some types of combination pills are meant to be taken continuously (365-day pills). With 21-day packs, you do not take pills for 7 days after the last pill. With 28-day packs, the pill is taken every day. The last 7 pills are without hormones. Certain types of pills have more than 21 hormone-containing pills. With 91-day packs, the first 84 pills contain both hormones, and the last 7 pills contain no hormones or contain estrogen only.  The minipill--This pill contains the progesterone hormone only. The pill is taken every day continuously. It is very important to take the pill at the same time each day. The minipill comes in packs of 28 pills. All 28 pills contain the hormone.  ADVANTAGES OF ORAL CONTRACEPTIVE PILLS  Decreases premenstrual symptoms.    Treats menstrual period cramps.   Regulates the menstrual cycle.   Decreases a heavy menstrual flow.   May treatacne, depending on the type of pill.   Treats abnormal uterine bleeding.   Treats polycystic ovarian syndrome.   Treats endometriosis.   Can be used as emergency contraception.  THINGS THAT CAN MAKE ORAL CONTRACEPTIVE PILLS LESS EFFECTIVE OCPs can be less effective if:   You forget to take the pill at the same time every day.   You have a stomach or intestinal disease that lessens the absorption of the pill.   You take OCPs with other medicines that make OCPs less effective, such as antibiotics, certain HIV medicines, and some seizure medicines.   You take expired OCPs.   You forget to restart the pill on day 7, when using the packs of 21 pills.  RISKS ASSOCIATED WITH ORAL CONTRACEPTIVE PILLS  Oral contraceptive pills can sometimes cause side effects, such as:  Headache.  Nausea.  Breast tenderness.  Irregular bleeding or spotting. Combination pills are also associated with a small increased risk of:  Blood clots.  Heart attack.  Stroke. Document Released: 07/17/2002 Document Revised: 02/14/2013 Document Reviewed: 10/15/2012 ExitCare Patient Information 2015 [employer]. This information is not intended to replace advice given to you by your health care provider. Make sure you discuss any questions you have with your health care provider.  

## 2014-07-18 LAB — IPS N GONORRHOEA AND CHLAMYDIA BY PCR

## 2014-07-21 NOTE — Progress Notes (Signed)
Encounter reviewed by Dr. Josefa Half.  I recommend signing a new designated party release form if the patient does not wish to share information with her mother about her sexual activity.

## 2014-08-01 ENCOUNTER — Telehealth: Payer: Self-pay | Admitting: Nurse Practitioner

## 2014-08-01 NOTE — Telephone Encounter (Signed)
Left patient a message to call back re: Suzanne Boroughs, FNP staff message.

## 2014-10-10 ENCOUNTER — Other Ambulatory Visit: Payer: Self-pay | Admitting: Nurse Practitioner

## 2014-10-10 NOTE — Telephone Encounter (Signed)
Medication refill request: Loryna  Last AEX:  07/16/14 with PG Next AEX: No AEX scheduled Last MMG (if hormonal medication request): n/a Refill authorized: #3 Packs or #1 pack?  According to last AEX note 07/16/14 patient was to return in 3 months for consult visit to f/u on Yaz. Patient is scheduled for 10/22/14 okay to send in #3 packs or #1 pack to last patient until OV?

## 2014-10-22 ENCOUNTER — Encounter: Payer: Self-pay | Admitting: Nurse Practitioner

## 2014-10-22 ENCOUNTER — Ambulatory Visit (INDEPENDENT_AMBULATORY_CARE_PROVIDER_SITE_OTHER): Payer: Managed Care, Other (non HMO) | Admitting: Nurse Practitioner

## 2014-10-22 VITALS — BP 120/70 | HR 52 | Ht 68.25 in | Wt 157.0 lb

## 2014-10-22 DIAGNOSIS — Z3009 Encounter for other general counseling and advice on contraception: Secondary | ICD-10-CM

## 2014-10-22 MED ORDER — DROSPIRENONE-ETHINYL ESTRADIOL 3-0.02 MG PO TABS: 1.0000 | ORAL_TABLET | Freq: Every day | ORAL | Status: DC

## 2014-10-22 NOTE — Progress Notes (Signed)
Patient ID: Batina Dougan, female   DOB: 10-07-1995, 19 y.o.   MRN: [id number] S: This  19 y.o. G0 Single Caucasian Fe here for here for a consult visit to follow up on OCP.  She is now on the 4th pack of Yasmin.  Since on OCP her menses is now lighter.  Menses is 4 days, moderate flow, some cramps, no headaches, no PMS.  First pack did well with no BTB.  Second pack menses became even lighter but still some cramps.  Third pack, menses is now lighter at 3 days.   Cramps are still better than off OCP.   Staying active this summer with no signs of DVT.   No cardiac or respiratory problems.  Now on Accutane for 1 month and has had some dryness of the face and lips.   LMP 10/09/14.  Same partner who is with her today.  No STD concerns and no other problems. 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

## 2014-10-22 NOTE — Patient Instructions (Signed)

## 2014-10-24 NOTE — Progress Notes (Signed)
Reviewed personally.  M. Suzanne Sherria Riemann, MD.  

## 2015-01-28 ENCOUNTER — Telehealth: Payer: Self-pay | Admitting: Cardiovascular Disease

## 2015-01-28 NOTE — Telephone Encounter (Signed)
Received records from [facility] for appointment on 01/30/15 with Dr Oval Linsey.  Records given to [facility] (medical records) for Dr Blenda Mounts schedule on 01/30/15. lp

## 2015-01-29 ENCOUNTER — Ambulatory Visit: Payer: Managed Care, Other (non HMO) | Admitting: Cardiology
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
## 2015-01-30 ENCOUNTER — Ambulatory Visit (INDEPENDENT_AMBULATORY_CARE_PROVIDER_SITE_OTHER): Payer: Managed Care, Other (non HMO) | Admitting: Cardiovascular Disease

## 2015-01-30 ENCOUNTER — Encounter: Payer: Self-pay | Admitting: Cardiovascular Disease

## 2015-01-30 VITALS — BP 126/78 | HR 50 | Ht 68.5 in | Wt 154.0 lb

## 2015-01-30 DIAGNOSIS — R011 Cardiac murmur, unspecified: Secondary | ICD-10-CM

## 2015-01-30 DIAGNOSIS — R42 Dizziness and giddiness: Secondary | ICD-10-CM | POA: Diagnosis not present

## 2015-01-30 DIAGNOSIS — R001 Bradycardia, unspecified: Secondary | ICD-10-CM | POA: Diagnosis not present

## 2015-01-30 NOTE — Patient Instructions (Signed)
Your physician has recommended that you wear an event monitor. Event monitors are medical devices that record the heart's electrical activity. Doctors most often [place] these monitors to diagnose arrhythmias. Arrhythmias are problems with the speed or rhythm of the heartbeat. The monitor is a small, portable device. You can wear one while you do your normal daily activities. This is usually used to diagnose what is causing palpitations/syncope (passing out).  Your physician has requested that you have an echocardiogram. Echocardiography is a painless test that uses sound waves to create images of your heart. It provides your doctor with information about the size and shape of your heart and how well your heart's chambers and valves are working. This procedure takes approximately one hour. There are no restrictions for this procedure.  Dr Oval Linsey recommends that you schedule a follow-up appointment in 3 months.  Your Doctor has ordered you to wear a heart monitor. You will wear this for 7 days.   TIPS -  REMINDERS 1. The sensor is the lanyard that is worn around your neck every day - this is powered by a battery that needs to be changed every day 2. The monitor is the device that allows you to record symptoms - this will need to be charged daily 3. The sensor & monitor need to be within 100 feet of each other at all times 4. The sensor connects to the electrodes (stickers) - these should be changed every 24-48 hours (you do not have to remove them when you bathe, just make sure they are dry when you connect it back to the sensor 5. If you need more supplies (electrodes, batteries), please call the 1-800 # on the back of the pamphlet and [employer] will mail you more supplies 6. If your skin becomes sensitive, please try the sample pack of sensitive skin electrodes (the white packet in your silver box) and call [employer] to have them mail you more of these type of electrodes 7. When you are finish wearing  the monitor, please place all supplies back in the silver box, place the silver box in the pre-packaged UPS bag and drop off at [employer] or call them so they can come pick it up   Cardiac Event Monitoring A cardiac event monitor is a small recording device used to help detect abnormal heart rhythms (arrhythmias). The monitor is used to record heart rhythm when noticeable symptoms such as the following occur:  Fast heartbeats (palpitations), such as heart racing or fluttering.  Dizziness.  Fainting or light-headedness.  Unexplained weakness. The monitor is wired to two electrodes placed on your chest. Electrodes are flat, sticky disks that attach to your skin. The monitor can be worn for up to 30 days. You will wear the monitor at all times, except when bathing.  HOW TO USE YOUR CARDIAC EVENT MONITOR A technician will prepare your chest for the electrode placement. The technician will show you how to place the electrodes, how to work the monitor, and how to replace the batteries. Take time to practice using the monitor before you leave the office. Make sure you understand how to send the information from the monitor to your health care provider. This requires a telephone with a landline, not a cell phone. You need to:  Wear your monitor at all times, except when you are in water:  Do not get the monitor wet.  Take the monitor off when bathing. Do not swim or use a hot tub with it on.  Keep  your skin clean. Do not put body lotion or moisturizer on your chest.  Change the electrodes daily or any time they stop sticking to your skin. You might need to use tape to keep them on.  It is possible that your skin under the electrodes could become irritated. To keep this from happening, try to put the electrodes in slightly different places on your chest. However, they must remain in the area under your left breast and in the upper right section of your chest.  Make sure the monitor is safely clipped to  your clothing or in a location close to your body that your health care provider recommends.  Press the button to record when you feel symptoms of heart trouble, such as dizziness, weakness, light-headedness, palpitations, thumping, shortness of breath, unexplained weakness, or a fluttering or racing heart. The monitor is always on and records what happened slightly before you pressed the button, so do not worry about being too late to get good information.  Keep a diary of your activities, such as walking, doing chores, and taking medicine. It is especially important to note what you were doing when you pushed the button to record your symptoms. This will help your health care provider determine what might be contributing to your symptoms. The information stored in your monitor will be reviewed by your health care provider alongside your diary entries.  Send the recorded information as recommended by your health care provider. It is important to understand that it will take some time for your health care provider to process the results.  Change the batteries as recommended by your health care provider. SEEK IMMEDIATE MEDICAL CARE IF:   You have chest pain.  You have extreme difficulty breathing or shortness of breath.  You develop a very fast heartbeat that persists.  You develop dizziness that does not go away.  You faint or constantly feel you are about to faint. Document Released: 02/03/2008 Document Revised: 09/10/2013 Document Reviewed: 10/23/2012 [facility] Patient Information 2015 [place], [place]. This information is not intended to replace advice given to you by your health care provider. Make sure you discuss any questions you have with your health care provider.

## 2015-02-06 ENCOUNTER — Other Ambulatory Visit: Payer: Self-pay

## 2015-02-06 ENCOUNTER — Ambulatory Visit (HOSPITAL_COMMUNITY): Payer: Managed Care, Other (non HMO) | Attending: Cardiology

## 2015-02-06 DIAGNOSIS — I517 Cardiomegaly: Secondary | ICD-10-CM | POA: Insufficient documentation

## 2015-02-06 DIAGNOSIS — R011 Cardiac murmur, unspecified: Secondary | ICD-10-CM | POA: Diagnosis not present

## 2015-02-06 DIAGNOSIS — I351 Nonrheumatic aortic (valve) insufficiency: Secondary | ICD-10-CM | POA: Insufficient documentation

## 2015-02-11 ENCOUNTER — Telehealth: Payer: Self-pay | Admitting: *Deleted

## 2015-02-11 NOTE — Telephone Encounter (Signed)
-----   Message from Skeet Latch, MD sent at 02/09/2015  6:45 PM EDT ----- Normal echo.

## 2015-02-11 NOTE — Telephone Encounter (Signed)
Spoke to patient. Result given . Verbalized understanding  

## 2015-02-12 ENCOUNTER — Telehealth: Payer: Self-pay | Admitting: Cardiovascular Disease

## 2015-02-12 NOTE — Telephone Encounter (Signed)
Results discussed w/ mother of patient (DPR on file). She voiced understanding of monitor results.  All inquiries addressed to caller's satisfaction.

## 2015-02-12 NOTE — Telephone Encounter (Signed)
Would like pt's monitor results.

## 2015-03-24 ENCOUNTER — Telehealth: Payer: Self-pay | Admitting: Nurse Practitioner

## 2015-03-24 NOTE — Telephone Encounter (Signed)
LMTCB about canceled appointment °

## 2015-04-29 ENCOUNTER — Ambulatory Visit: Payer: Managed Care, Other (non HMO) | Admitting: Cardiovascular Disease
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
## 2015-05-13 ENCOUNTER — Ambulatory Visit (INDEPENDENT_AMBULATORY_CARE_PROVIDER_SITE_OTHER): Payer: Managed Care, Other (non HMO) | Admitting: Cardiovascular Disease

## 2015-05-13 ENCOUNTER — Encounter: Payer: Self-pay | Admitting: Cardiovascular Disease

## 2015-05-13 VITALS — BP 120/70 | HR 53 | Ht 68.5 in | Wt 152.3 lb

## 2015-05-13 DIAGNOSIS — R001 Bradycardia, unspecified: Secondary | ICD-10-CM

## 2015-05-13 DIAGNOSIS — R42 Dizziness and giddiness: Secondary | ICD-10-CM

## 2015-05-13 HISTORY — DX: Bradycardia, unspecified: R00.1

## 2015-05-13 HISTORY — DX: Dizziness and giddiness: R42

## 2015-05-13 NOTE — Patient Instructions (Signed)
Dr Alma recommends that you follow-up with her as needed. 

## 2015-07-25 ENCOUNTER — Ambulatory Visit (INDEPENDENT_AMBULATORY_CARE_PROVIDER_SITE_OTHER): Payer: Managed Care, Other (non HMO) | Admitting: Nurse Practitioner

## 2015-07-25 ENCOUNTER — Encounter: Payer: Self-pay | Admitting: Nurse Practitioner

## 2015-07-25 VITALS — BP 118/70 | HR 68 | Resp 16 | Ht 67.75 in | Wt 157.0 lb

## 2015-07-25 DIAGNOSIS — Z01419 Encounter for gynecological examination (general) (routine) without abnormal findings: Secondary | ICD-10-CM | POA: Diagnosis not present

## 2015-07-25 DIAGNOSIS — Z0000 Encounter for general adult medical examination without abnormal findings: Secondary | ICD-10-CM | POA: Diagnosis not present

## 2015-07-25 DIAGNOSIS — N760 Acute vaginitis: Secondary | ICD-10-CM

## 2015-07-25 LAB — POCT URINALYSIS DIPSTICK
Bilirubin, UA: NEGATIVE
Blood, UA: NEGATIVE
Glucose, UA: NEGATIVE
KETONES UA: NEGATIVE
LEUKOCYTES UA: NEGATIVE
Nitrite, UA: NEGATIVE
PH UA: 5
Protein, UA: NEGATIVE
Urobilinogen, UA: NEGATIVE

## 2015-07-25 MED ORDER — LORYNA 3-0.02 MG PO TABS: 1.0000 | ORAL_TABLET | Freq: Every day | ORAL | Status: DC
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
## 2015-07-25 NOTE — Patient Instructions (Signed)
General topics  Next pap or exam is  due in 1 year Take a Women's multivitamin Take 1200 mg. of calcium daily - prefer dietary If any concerns in interim to call back  Breast Self-Awareness Practicing breast self-awareness may pick up problems early, prevent significant medical complications, and possibly save your life. By practicing breast self-awareness, you can become familiar with how your breasts look and feel and if your breasts are changing. This allows you to notice changes early. It can also offer you some reassurance that your breast health is good. One way to learn what is normal for your breasts and whether your breasts are changing is to do a breast self-exam. If you find a lump or something that was not present in the past, it is best to contact your caregiver right away. Other findings that should be evaluated by your caregiver include nipple discharge, especially if it is bloody; skin changes or reddening; areas where the skin seems to be pulled in (retracted); or new lumps and bumps. Breast pain is seldom associated with cancer (malignancy), but should also be evaluated by a caregiver. BREAST SELF-EXAM The best time to examine your breasts is 5 7 days after your menstrual period is over.  ExitCare Patient Information 2013 ExitCare, LLC.   Exercise to Stay Healthy Exercise helps you become and stay healthy. EXERCISE IDEAS AND TIPS Choose exercises that:  You enjoy.  Fit into your day. You do not need to exercise really hard to be healthy. You can do exercises at a slow or medium level and stay healthy. You can:  Stretch before and after working out.  Try yoga, Pilates, or tai chi.  Lift weights.  Walk fast, swim, jog, run, climb stairs, bicycle, dance, or rollerskate.  Take aerobic classes. Exercises that burn about 150 calories:  Running 1  miles in 15 minutes.  Playing volleyball for 45 to 60 minutes.  Washing and waxing a car for 45 to 60  minutes.  Playing touch football for 45 minutes.  Walking 1  miles in 35 minutes.  Pushing a stroller 1  miles in 30 minutes.  Playing basketball for 30 minutes.  Raking leaves for 30 minutes.  Bicycling 5 miles in 30 minutes.  Walking 2 miles in 30 minutes.  Dancing for 30 minutes.  Shoveling snow for 15 minutes.  Swimming laps for 20 minutes.  Walking up stairs for 15 minutes.  Bicycling 4 miles in 15 minutes.  Gardening for 30 to 45 minutes.  Jumping rope for 15 minutes.  Washing windows or floors for 45 to 60 minutes. Document Released: 05/29/2010 Document Revised: 07/19/2011 Document Reviewed: 05/29/2010 ExitCare Patient Information 2013 ExitCare[employer] LLC.   Other topics ( that may be useful information):    Sexually Transmitted Disease Sexually transmitted disease (STD) refers to any infection that is passed from person to person during sexual activity. This may happen by way of saliva, semen, blood, vaginal mucus, or urine. Common STDs include:  Gonorrhea.  Chlamydia.  Syphilis.  HIV/AIDS.  Genital herpes.  Hepatitis B and C.  Trichomonas.  Human papillomavirus (HPV).  Pubic lice. CAUSES  An STD may be spread by bacteria, virus, or parasite. A person can get an STD by:  Sexual intercourse with an infected person.  Sharing sex toys with an infected person.  Sharing needles with an infected person.  Having intimate contact with the genitals, mouth, or rectal areas of an infected person. SYMPTOMS  Some people may not have any symptoms, but   they can still pass the infection to others. Different STDs have different symptoms. Symptoms include:  Painful or bloody urination.  Pain in the pelvis, abdomen, vagina, anus, throat, or eyes.  Skin rash, itching, irritation, growths, or sores (lesions). These usually occur in the genital or anal area.  Abnormal vaginal discharge.  Penile discharge in men.  Soft, flesh-colored skin growths in the  genital or anal area.  Fever.  Pain or bleeding during sexual intercourse.  Swollen glands in the groin area.  Yellow skin and eyes (jaundice). This is seen with hepatitis. DIAGNOSIS  To make a diagnosis, your caregiver may:  Take a medical history.  Perform a physical exam.  Take a specimen (culture) to be examined.  Examine a sample of discharge under a microscope.  Perform blood test TREATMENT   Chlamydia, gonorrhea, trichomonas, and syphilis can be cured with antibiotic medicine.  Genital herpes, hepatitis, and HIV can be treated, but not cured, with prescribed medicines. The medicines will lessen the symptoms.  Genital warts from HPV can be treated with medicine or by freezing, burning (electrocautery), or surgery. Warts may come back.  HPV is a virus and cannot be cured with medicine or surgery.However, abnormal areas may be followed very closely by your caregiver and may be removed from the cervix, vagina, or vulva through office procedures or surgery. If your diagnosis is confirmed, your recent sexual partners need treatment. This is true even if they are symptom-free or have a negative culture or evaluation. They should not have sex until their caregiver says it is okay. HOME CARE INSTRUCTIONS  All sexual partners should be informed, tested, and treated for all STDs.  Take your antibiotics as directed. Finish them even if you start to feel better.  Only take over-the-counter or prescription medicines for pain, discomfort, or fever as directed by your caregiver.  Rest.  Eat a balanced diet and drink enough fluids to keep your urine clear or pale yellow.  Do not have sex until treatment is completed and you have followed up with your caregiver. STDs should be checked after treatment.  Keep all follow-up appointments, Pap tests, and blood tests as directed by your caregiver.  Only use latex condoms and water-soluble lubricants during sexual activity. Do not use  petroleum jelly or oils.  Avoid alcohol and illegal drugs.  Get vaccinated for HPV and hepatitis. If you have not received these vaccines in the past, talk to your caregiver about whether one or both might be right for you.  Avoid risky sex practices that can break the skin. The only way to avoid getting an STD is to avoid all sexual activity.Latex condoms and dental dams (for oral sex) will help lessen the risk of getting an STD, but will not completely eliminate the risk. SEEK MEDICAL CARE IF:   You have a fever.  You have any new or worsening symptoms. Document Released: 07/17/2002 Document Revised: 07/19/2011 Document Reviewed: 07/24/2010 [facility] Patient Information 2013 [place].    Domestic Abuse You are being battered or abused if someone close to you hits, pushes, or physically hurts you in any way. You also are being abused if you are forced into activities. You are being sexually abused if you are forced to have sexual contact of any kind. You are being emotionally abused if you are made to feel worthless or if you are constantly threatened. It is important to remember that help is available. No one has the right to abuse you. PREVENTION OF FURTHER  ABUSE  Learn the warning signs of danger. This varies with situations but may include: the use of alcohol, threats, isolation from friends and family, or forced sexual contact. Leave if you feel that violence is going to occur.  If you are attacked or beaten, report it to the police so the abuse is documented. You do not have to press charges. The police can protect you while you or the attackers are leaving. Get the officer's name and badge number and a copy of the report.  Find someone you can trust and tell them what is happening to you: your caregiver, a nurse, clergy member, close friend or family member. Feeling ashamed is natural, but remember that you have done nothing wrong. No one deserves abuse. Document Released:  04/23/2000 Document Revised: 07/19/2011 Document Reviewed: 07/02/2010 ExitCare Patient Information 2013 ExitCare, LLC.    How Much is Too Much Alcohol? Drinking too much alcohol can cause injury, accidents, and health problems. These types of problems can include:   Car crashes.  Falls.  Family fighting (domestic violence).  Drowning.  Fights.  Injuries.  Burns.  Damage to certain organs.  Having a baby with birth defects. ONE DRINK CAN BE TOO MUCH WHEN YOU ARE:  Working.  Pregnant or breastfeeding.  Taking medicines. Ask your doctor.  Driving or planning to drive. If you or someone you know has a drinking problem, get help from a doctor.  Document Released: 02/20/2009 Document Revised: 07/19/2011 Document Reviewed: 02/20/2009 ExitCare Patient Information 2013 ExitCare, LLC.   Smoking Hazards Smoking cigarettes is extremely bad for your health. Tobacco smoke has over 200 known poisons in it. There are over 60 chemicals in tobacco smoke that cause cancer. Some of the chemicals found in cigarette smoke include:   Cyanide.  Benzene.  Formaldehyde.  Methanol (wood alcohol).  Acetylene (fuel used in welding torches).  Ammonia. Cigarette smoke also contains the poisonous gases nitrogen oxide and carbon monoxide.  Cigarette smokers have an increased risk of many serious medical problems and Smoking causes approximately:  90% of all lung cancer deaths in men.  80% of all lung cancer deaths in women.  90% of deaths from chronic obstructive lung disease. Compared with nonsmokers, smoking increases the risk of:  Coronary heart disease by 2 to 4 times.  Stroke by 2 to 4 times.  Men developing lung cancer by 23 times.  Women developing lung cancer by 13 times.  Dying from chronic obstructive lung diseases by 12 times.  . Smoking is the most preventable cause of death and disease in our society.  WHY IS SMOKING ADDICTIVE?  Nicotine is the chemical  agent in tobacco that is capable of causing addiction or dependence.  When you smoke and inhale, nicotine is absorbed rapidly into the bloodstream through your lungs. Nicotine absorbed through the lungs is capable of creating a powerful addiction. Both inhaled and non-inhaled nicotine may be addictive.  Addiction studies of cigarettes and spit tobacco show that addiction to nicotine occurs mainly during the teen years, when young people begin using tobacco products. WHAT ARE THE BENEFITS OF QUITTING?  There are many health benefits to quitting smoking.   Likelihood of developing cancer and heart disease decreases. Health improvements are seen almost immediately.  Blood pressure, pulse rate, and breathing patterns start returning to normal soon after quitting. QUITTING SMOKING   American Lung Association - 1-800-LUNGUSA  American Cancer Society - 1-800-ACS-2345 Document Released: 06/03/2004 Document Revised: 07/19/2011 Document Reviewed: 02/05/2009 ExitCare Patient Information 2013 ExitCare,   LLC.   Stress Management Stress is a state of physical or mental tension that often results from changes in your life or normal routine. Some common causes of stress are:  Death of a loved one.  Injuries or severe illnesses.  Getting fired or changing jobs.  Moving into a new home. Other causes may be:  Sexual problems.  Business or financial losses.  Taking on a large debt.  Regular conflict with someone at home or at work.  Constant tiredness from lack of sleep. It is not just bad things that are stressful. It may be stressful to:  Win the lottery.  Get married.  Buy a new car. The amount of stress that can be easily tolerated varies from person to person. Changes generally cause stress, regardless of the types of change. Too much stress can affect your health. It may lead to physical or emotional problems. Too little stress (boredom) may also become stressful. SUGGESTIONS TO  REDUCE STRESS:  Talk things over with your family and friends. It often is helpful to share your concerns and worries. If you feel your problem is serious, you may want to get help from a professional counselor.  Consider your problems one at a time instead of lumping them all together. Trying to take care of everything at once may seem impossible. List all the things you need to do and then start with the most important one. Set a goal to accomplish 2 or 3 things each day. If you expect to do too many in a single day you will naturally fail, causing you to feel even more stressed.  Do not use alcohol or drugs to relieve stress. Although you may feel better for a short time, they do not remove the problems that caused the stress. They can also be habit forming.  Exercise regularly - at least 3 times per week. Physical exercise can help to relieve that "uptight" feeling and will relax you.  The shortest distance between despair and hope is often a good night's sleep.  Go to bed and get up on time allowing yourself time for appointments without being rushed.  Take a short "time-out" period from any stressful situation that occurs during the day. Close your eyes and take some deep breaths. Starting with the muscles in your face, tense them, hold it for a few seconds, then relax. Repeat this with the muscles in your neck, shoulders, hand, stomach, back and legs.  Take good care of yourself. Eat a balanced diet and get plenty of rest.  Schedule time for having fun. Take a break from your daily routine to relax. HOME CARE INSTRUCTIONS   Call if you feel overwhelmed by your problems and feel you can no longer manage them on your own.  Return immediately if you feel like hurting yourself or someone else. Document Released: 10/20/2000 Document Revised: 07/19/2011 Document Reviewed: 06/12/2007 ExitCare Patient Information 2013 ExitCare, LLC.  

## 2015-07-26 ENCOUNTER — Telehealth: Payer: Self-pay | Admitting: Nurse Practitioner

## 2015-07-26 ENCOUNTER — Other Ambulatory Visit: Payer: Self-pay | Admitting: Nurse Practitioner

## 2015-07-26 LAB — WET PREP BY MOLECULAR PROBE
CANDIDA SPECIES: NEGATIVE
Gardnerella vaginalis: POSITIVE — AB
TRICHOMONAS VAG: NEGATIVE

## 2015-07-26 MED ORDER — METRONIDAZOLE 0.75 % VA GEL: 1.0000 | Freq: Every day | VAGINAL | Status: DC

## 2015-07-26 NOTE — Telephone Encounter (Signed)
See lab result note. Message left on cell phone.

## 2015-07-27 NOTE — Progress Notes (Signed)
Encounter reviewed by Dr. Dawood Spitler Amundson C. Silva.  

## 2015-07-29 LAB — IPS PAP TEST WITH REFLEX TO HPV

## 2015-08-05 ENCOUNTER — Ambulatory Visit: Payer: Managed Care, Other (non HMO) | Admitting: Nurse Practitioner

## 2015-08-13 ENCOUNTER — Ambulatory Visit: Payer: Managed Care, Other (non HMO) | Admitting: Nurse Practitioner

## 2016-07-26 ENCOUNTER — Ambulatory Visit: Payer: Managed Care, Other (non HMO) | Admitting: Nurse Practitioner

## 2016-07-27 ENCOUNTER — Ambulatory Visit (INDEPENDENT_AMBULATORY_CARE_PROVIDER_SITE_OTHER): Payer: Managed Care, Other (non HMO) | Admitting: Nurse Practitioner

## 2016-07-27 ENCOUNTER — Encounter: Payer: Self-pay | Admitting: Nurse Practitioner

## 2016-07-27 VITALS — BP 120/76 | HR 68 | Ht 68.5 in | Wt 155.0 lb

## 2016-07-27 DIAGNOSIS — Z0000 Encounter for general adult medical examination without abnormal findings: Secondary | ICD-10-CM

## 2016-07-27 DIAGNOSIS — Z01419 Encounter for gynecological examination (general) (routine) without abnormal findings: Secondary | ICD-10-CM

## 2016-07-27 MED ORDER — LORYNA 3-0.02 MG PO TABS: 1.0000 | ORAL_TABLET | Freq: Every day | ORAL | 4 refills | Status: DC

## 2016-07-27 NOTE — Patient Instructions (Signed)
General topics  Next pap or exam is  due in 1 year Take a Women's multivitamin Take 1200 mg. of calcium daily - prefer dietary If any concerns in interim to call back  Breast Self-Awareness Practicing breast self-awareness may pick up problems early, prevent significant medical complications, and possibly save your life. By practicing breast self-awareness, you can become familiar with how your breasts look and feel and if your breasts are changing. This allows you to notice changes early. It can also offer you some reassurance that your breast health is good. One way to learn what is normal for your breasts and whether your breasts are changing is to do a breast self-exam. If you find a lump or something that was not present in the past, it is best to contact your caregiver right away. Other findings that should be evaluated by your caregiver include nipple discharge, especially if it is bloody; skin changes or reddening; areas where the skin seems to be pulled in (retracted); or new lumps and bumps. Breast pain is seldom associated with cancer (malignancy), but should also be evaluated by a caregiver. BREAST SELF-EXAM The best time to examine your breasts is 5 7 days after your menstrual period is over.  ExitCare Patient Information 2013 ExitCare, LLC.   Exercise to Stay Healthy Exercise helps you become and stay healthy. EXERCISE IDEAS AND TIPS Choose exercises that:  You enjoy.  Fit into your day. You do not need to exercise really hard to be healthy. You can do exercises at a slow or medium level and stay healthy. You can:  Stretch before and after working out.  Try yoga, Pilates, or tai chi.  Lift weights.  Walk fast, swim, jog, run, climb stairs, bicycle, dance, or rollerskate.  Take aerobic classes. Exercises that burn about 150 calories:  Running 1  miles in 15 minutes.  Playing volleyball for 45 to 60 minutes.  Washing and waxing a car for 45 to 60  minutes.  Playing touch football for 45 minutes.  Walking 1  miles in 35 minutes.  Pushing a stroller 1  miles in 30 minutes.  Playing basketball for 30 minutes.  Raking leaves for 30 minutes.  Bicycling 5 miles in 30 minutes.  Walking 2 miles in 30 minutes.  Dancing for 30 minutes.  Shoveling snow for 15 minutes.  Swimming laps for 20 minutes.  Walking up stairs for 15 minutes.  Bicycling 4 miles in 15 minutes.  Gardening for 30 to 45 minutes.  Jumping rope for 15 minutes.  Washing windows or floors for 45 to 60 minutes. Document Released: 05/29/2010 Document Revised: 07/19/2011 Document Reviewed: 05/29/2010 ExitCare Patient Information 2013 ExitCare[employer] LLC.   Other topics ( that may be useful information):    Sexually Transmitted Disease Sexually transmitted disease (STD) refers to any infection that is passed from person to person during sexual activity. This may happen by way of saliva, semen, blood, vaginal mucus, or urine. Common STDs include:  Gonorrhea.  Chlamydia.  Syphilis.  HIV/AIDS.  Genital herpes.  Hepatitis B and C.  Trichomonas.  Human papillomavirus (HPV).  Pubic lice. CAUSES  An STD may be spread by bacteria, virus, or parasite. A person can get an STD by:  Sexual intercourse with an infected person.  Sharing sex toys with an infected person.  Sharing needles with an infected person.  Having intimate contact with the genitals, mouth, or rectal areas of an infected person. SYMPTOMS  Some people may not have any symptoms, but   they can still pass the infection to others. Different STDs have different symptoms. Symptoms include:  Painful or bloody urination.  Pain in the pelvis, abdomen, vagina, anus, throat, or eyes.  Skin rash, itching, irritation, growths, or sores (lesions). These usually occur in the genital or anal area.  Abnormal vaginal discharge.  Penile discharge in men.  Soft, flesh-colored skin growths in the  genital or anal area.  Fever.  Pain or bleeding during sexual intercourse.  Swollen glands in the groin area.  Yellow skin and eyes (jaundice). This is seen with hepatitis. DIAGNOSIS  To make a diagnosis, your caregiver may:  Take a medical history.  Perform a physical exam.  Take a specimen (culture) to be examined.  Examine a sample of discharge under a microscope.  Perform blood test TREATMENT   Chlamydia, gonorrhea, trichomonas, and syphilis can be cured with antibiotic medicine.  Genital herpes, hepatitis, and HIV can be treated, but not cured, with prescribed medicines. The medicines will lessen the symptoms.  Genital warts from HPV can be treated with medicine or by freezing, burning (electrocautery), or surgery. Warts may come back.  HPV is a virus and cannot be cured with medicine or surgery.However, abnormal areas may be followed very closely by your caregiver and may be removed from the cervix, vagina, or vulva through office procedures or surgery. If your diagnosis is confirmed, your recent sexual partners need treatment. This is true even if they are symptom-free or have a negative culture or evaluation. They should not have sex until their caregiver says it is okay. HOME CARE INSTRUCTIONS  All sexual partners should be informed, tested, and treated for all STDs.  Take your antibiotics as directed. Finish them even if you start to feel better.  Only take over-the-counter or prescription medicines for pain, discomfort, or fever as directed by your caregiver.  Rest.  Eat a balanced diet and drink enough fluids to keep your urine clear or pale yellow.  Do not have sex until treatment is completed and you have followed up with your caregiver. STDs should be checked after treatment.  Keep all follow-up appointments, Pap tests, and blood tests as directed by your caregiver.  Only use latex condoms and water-soluble lubricants during sexual activity. Do not use  petroleum jelly or oils.  Avoid alcohol and illegal drugs.  Get vaccinated for HPV and hepatitis. If you have not received these vaccines in the past, talk to your caregiver about whether one or both might be right for you.  Avoid risky sex practices that can break the skin. The only way to avoid getting an STD is to avoid all sexual activity.Latex condoms and dental dams (for oral sex) will help lessen the risk of getting an STD, but will not completely eliminate the risk. SEEK MEDICAL CARE IF:   You have a fever.  You have any new or worsening symptoms. Document Released: 07/17/2002 Document Revised: 07/19/2011 Document Reviewed: 07/24/2010 [facility] Patient Information 2013 [place].    Domestic Abuse You are being battered or abused if someone close to you hits, pushes, or physically hurts you in any way. You also are being abused if you are forced into activities. You are being sexually abused if you are forced to have sexual contact of any kind. You are being emotionally abused if you are made to feel worthless or if you are constantly threatened. It is important to remember that help is available. No one has the right to abuse you. PREVENTION OF FURTHER  ABUSE  Learn the warning signs of danger. This varies with situations but may include: the use of alcohol, threats, isolation from friends and family, or forced sexual contact. Leave if you feel that violence is going to occur.  If you are attacked or beaten, report it to the police so the abuse is documented. You do not have to press charges. The police can protect you while you or the attackers are leaving. Get the officer's name and badge number and a copy of the report.  Find someone you can trust and tell them what is happening to you: your caregiver, a nurse, clergy member, close friend or family member. Feeling ashamed is natural, but remember that you have done nothing wrong. No one deserves abuse. Document Released:  04/23/2000 Document Revised: 07/19/2011 Document Reviewed: 07/02/2010 ExitCare Patient Information 2013 ExitCare, LLC.    How Much is Too Much Alcohol? Drinking too much alcohol can cause injury, accidents, and health problems. These types of problems can include:   Car crashes.  Falls.  Family fighting (domestic violence).  Drowning.  Fights.  Injuries.  Burns.  Damage to certain organs.  Having a baby with birth defects. ONE DRINK CAN BE TOO MUCH WHEN YOU ARE:  Working.  Pregnant or breastfeeding.  Taking medicines. Ask your doctor.  Driving or planning to drive. If you or someone you know has a drinking problem, get help from a doctor.  Document Released: 02/20/2009 Document Revised: 07/19/2011 Document Reviewed: 02/20/2009 ExitCare Patient Information 2013 ExitCare, LLC.   Smoking Hazards Smoking cigarettes is extremely bad for your health. Tobacco smoke has over 200 known poisons in it. There are over 60 chemicals in tobacco smoke that cause cancer. Some of the chemicals found in cigarette smoke include:   Cyanide.  Benzene.  Formaldehyde.  Methanol (wood alcohol).  Acetylene (fuel used in welding torches).  Ammonia. Cigarette smoke also contains the poisonous gases nitrogen oxide and carbon monoxide.  Cigarette smokers have an increased risk of many serious medical problems and Smoking causes approximately:  90% of all lung cancer deaths in men.  80% of all lung cancer deaths in women.  90% of deaths from chronic obstructive lung disease. Compared with nonsmokers, smoking increases the risk of:  Coronary heart disease by 2 to 4 times.  Stroke by 2 to 4 times.  Men developing lung cancer by 23 times.  Women developing lung cancer by 13 times.  Dying from chronic obstructive lung diseases by 12 times.  . Smoking is the most preventable cause of death and disease in our society.  WHY IS SMOKING ADDICTIVE?  Nicotine is the chemical  agent in tobacco that is capable of causing addiction or dependence.  When you smoke and inhale, nicotine is absorbed rapidly into the bloodstream through your lungs. Nicotine absorbed through the lungs is capable of creating a powerful addiction. Both inhaled and non-inhaled nicotine may be addictive.  Addiction studies of cigarettes and spit tobacco show that addiction to nicotine occurs mainly during the teen years, when young people begin using tobacco products. WHAT ARE THE BENEFITS OF QUITTING?  There are many health benefits to quitting smoking.   Likelihood of developing cancer and heart disease decreases. Health improvements are seen almost immediately.  Blood pressure, pulse rate, and breathing patterns start returning to normal soon after quitting. QUITTING SMOKING   American Lung Association - 1-800-LUNGUSA  American Cancer Society - 1-800-ACS-2345 Document Released: 06/03/2004 Document Revised: 07/19/2011 Document Reviewed: 02/05/2009 ExitCare Patient Information 2013 ExitCare,   LLC.   Stress Management Stress is a state of physical or mental tension that often results from changes in your life or normal routine. Some common causes of stress are:  Death of a loved one.  Injuries or severe illnesses.  Getting fired or changing jobs.  Moving into a new home. Other causes may be:  Sexual problems.  Business or financial losses.  Taking on a large debt.  Regular conflict with someone at home or at work.  Constant tiredness from lack of sleep. It is not just bad things that are stressful. It may be stressful to:  Win the lottery.  Get married.  Buy a new car. The amount of stress that can be easily tolerated varies from person to person. Changes generally cause stress, regardless of the types of change. Too much stress can affect your health. It may lead to physical or emotional problems. Too little stress (boredom) may also become stressful. SUGGESTIONS TO  REDUCE STRESS:  Talk things over with your family and friends. It often is helpful to share your concerns and worries. If you feel your problem is serious, you may want to get help from a professional counselor.  Consider your problems one at a time instead of lumping them all together. Trying to take care of everything at once may seem impossible. List all the things you need to do and then start with the most important one. Set a goal to accomplish 2 or 3 things each day. If you expect to do too many in a single day you will naturally fail, causing you to feel even more stressed.  Do not use alcohol or drugs to relieve stress. Although you may feel better for a short time, they do not remove the problems that caused the stress. They can also be habit forming.  Exercise regularly - at least 3 times per week. Physical exercise can help to relieve that "uptight" feeling and will relax you.  The shortest distance between despair and hope is often a good night's sleep.  Go to bed and get up on time allowing yourself time for appointments without being rushed.  Take a short "time-out" period from any stressful situation that occurs during the day. Close your eyes and take some deep breaths. Starting with the muscles in your face, tense them, hold it for a few seconds, then relax. Repeat this with the muscles in your neck, shoulders, hand, stomach, back and legs.  Take good care of yourself. Eat a balanced diet and get plenty of rest.  Schedule time for having fun. Take a break from your daily routine to relax. HOME CARE INSTRUCTIONS   Call if you feel overwhelmed by your problems and feel you can no longer manage them on your own.  Return immediately if you feel like hurting yourself or someone else. Document Released: 10/20/2000 Document Revised: 07/19/2011 Document Reviewed: 06/12/2007 ExitCare Patient Information 2013 ExitCare, LLC.  
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
## 2016-07-29 NOTE — Progress Notes (Signed)
Encounter reviewed by Dr. Anitta Tenny Amundson C. Silva.  

## 2016-08-11 ENCOUNTER — Other Ambulatory Visit: Payer: Self-pay | Admitting: Nurse Practitioner

## 2016-08-19 ENCOUNTER — Other Ambulatory Visit: Payer: Self-pay | Admitting: *Deleted

## 2016-08-19 NOTE — Telephone Encounter (Signed)
Faxed refill request received from [facility] for [place] Last filled by MD on 07/27/16 x 1 year Last AEX - 07/27/16 Next AEX - 08/02/17  RX denied as new RX was sent at annual exam on 07/27/16.

## 2016-12-14 ENCOUNTER — Telehealth: Payer: Self-pay | Admitting: Obstetrics & Gynecology

## 2016-12-14 NOTE — Telephone Encounter (Signed)
Left patient a message to call back to reschedule a future appointment that was cancelled by the provider. °

## 2016-12-27 ENCOUNTER — Telehealth: Payer: Self-pay | Admitting: Obstetrics & Gynecology

## 2016-12-27 NOTE — Telephone Encounter (Signed)
Patient called and requested to schedule appointment for lump in left breast that has been there for about a year. Patient scheduled for 01/03/17 with Dr. Sabra Heck. Patient declined earlier appointments.   Routing to provider for final review. Patient agreeable to disposition. Will close encounter.

## 2016-12-27 NOTE — Telephone Encounter (Signed)
Left message to call Kaitlyn at [phone]. 

## 2016-12-27 NOTE — Telephone Encounter (Signed)
Patient left voicemail that she has a medical question.

## 2017-01-03 ENCOUNTER — Encounter: Payer: Self-pay | Admitting: Obstetrics & Gynecology

## 2017-01-03 ENCOUNTER — Ambulatory Visit (INDEPENDENT_AMBULATORY_CARE_PROVIDER_SITE_OTHER): Payer: 59 | Admitting: Obstetrics & Gynecology

## 2017-01-03 VITALS — BP 120/64 | HR 56 | Ht 68.5 in | Wt 153.0 lb

## 2017-01-03 DIAGNOSIS — N6320 Unspecified lump in the left breast, unspecified quadrant: Secondary | ICD-10-CM | POA: Diagnosis not present
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
## 2017-01-03 NOTE — Progress Notes (Signed)
Scheduled patient while in office for left breast ultrasound at the [facility] on 01/07/2017 at 10:30 am.Patient is agreeable to date and time. Placed in mammogram hold.

## 2017-01-07 ENCOUNTER — Other Ambulatory Visit: Payer: Self-pay | Admitting: Obstetrics & Gynecology

## 2017-01-07 ENCOUNTER — Ambulatory Visit
Admission: RE | Admit: 2017-01-07 | Discharge: 2017-01-07 | Disposition: A | Discharge status: Home / Self Care | Payer: Managed Care, Other (non HMO) | Source: Ambulatory Visit | Attending: Obstetrics & Gynecology | Admitting: Obstetrics & Gynecology

## 2017-01-07 DIAGNOSIS — N6320 Unspecified lump in the left breast, unspecified quadrant: Secondary | ICD-10-CM

## 2017-07-08 ENCOUNTER — Other Ambulatory Visit: Payer: Self-pay | Admitting: Obstetrics & Gynecology

## 2017-07-08 ENCOUNTER — Ambulatory Visit
Admission: RE | Admit: 2017-07-08 | Discharge: 2017-07-08 | Disposition: A | Discharge status: Home / Self Care | Payer: 59 | Source: Ambulatory Visit | Attending: Obstetrics & Gynecology | Admitting: Obstetrics & Gynecology

## 2017-07-08 DIAGNOSIS — N6320 Unspecified lump in the left breast, unspecified quadrant: Secondary | ICD-10-CM

## 2017-07-19 ENCOUNTER — Other Ambulatory Visit: Payer: Self-pay | Admitting: *Deleted

## 2017-07-19 MED ORDER — LORYNA 3-0.02 MG PO TABS: 1.0000 | ORAL_TABLET | Freq: Every day | ORAL | 0 refills | Status: DC

## 2017-07-19 NOTE — Telephone Encounter (Signed)
Medication refill request: OCP Last AEX:  07/27/16 PG  Next AEX: 08/09/17  Last MMG (if hormonal medication request): 07/08/17 [place] left breast BIRADS 3 probably benign- 6 month follow scheduled 01/13/18  Refill authorized: 07/27/16 #3, 4RF. Today, please advise.

## 2017-08-02 ENCOUNTER — Ambulatory Visit: Payer: Managed Care, Other (non HMO) | Admitting: Nurse Practitioner

## 2017-08-09 ENCOUNTER — Ambulatory Visit: Payer: Managed Care, Other (non HMO) | Admitting: Obstetrics & Gynecology

## 2017-09-02 ENCOUNTER — Other Ambulatory Visit: Payer: Self-pay

## 2017-09-02 ENCOUNTER — Encounter: Payer: Self-pay | Admitting: Certified Nurse Midwife

## 2017-09-02 ENCOUNTER — Ambulatory Visit (INDEPENDENT_AMBULATORY_CARE_PROVIDER_SITE_OTHER): Payer: 59 | Admitting: Certified Nurse Midwife

## 2017-09-02 VITALS — BP 122/76 | HR 68 | Resp 16 | Ht 68.25 in | Wt 158.0 lb

## 2017-09-02 DIAGNOSIS — Z01419 Encounter for gynecological examination (general) (routine) without abnormal findings: Secondary | ICD-10-CM | POA: Diagnosis not present

## 2017-09-02 DIAGNOSIS — D242 Benign neoplasm of left breast: Secondary | ICD-10-CM | POA: Diagnosis not present

## 2017-09-02 DIAGNOSIS — Z3041 Encounter for surveillance of contraceptive pills: Secondary | ICD-10-CM

## 2017-09-02 DIAGNOSIS — N6320 Unspecified lump in the left breast, unspecified quadrant: Secondary | ICD-10-CM

## 2017-09-02 MED ORDER — LORYNA 3-0.02 MG PO TABS: 1.0000 | ORAL_TABLET | Freq: Every day | ORAL | 4 refills | Status: DC
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
## 2017-09-02 NOTE — Patient Instructions (Signed)
General topics  Next pap or exam is  due in 1 year Take a Women's multivitamin Take 1200 mg. of calcium daily - prefer dietary If any concerns in interim to call back  Breast Self-Awareness Practicing breast self-awareness may pick up problems early, prevent significant medical complications, and possibly save your life. By practicing breast self-awareness, you can become familiar with how your breasts look and feel and if your breasts are changing. This allows you to notice changes early. It can also offer you some reassurance that your breast health is good. One way to learn what is normal for your breasts and whether your breasts are changing is to do a breast self-exam. If you find a lump or something that was not present in the past, it is best to contact your caregiver right away. Other findings that should be evaluated by your caregiver include nipple discharge, especially if it is bloody; skin changes or reddening; areas where the skin seems to be pulled in (retracted); or new lumps and bumps. Breast pain is seldom associated with cancer (malignancy), but should also be evaluated by a caregiver. BREAST SELF-EXAM The best time to examine your breasts is 5 7 days after your menstrual period is over.  ExitCare Patient Information 2013 [employer].   Exercise to Stay Healthy Exercise helps you become and stay healthy. EXERCISE IDEAS AND TIPS Choose exercises that:  You enjoy.  Fit into your day. You do not need to exercise really hard to be healthy. You can do exercises at a slow or medium level and stay healthy. You can:  Stretch before and after working out.  Try yoga, Pilates, or tai chi.  Lift weights.  Walk fast, swim, jog, run, climb stairs, bicycle, dance, or rollerskate.  Take aerobic classes. Exercises that burn about 150 calories:  Running 1  miles in 15 minutes.  Playing volleyball for 45 to 60 minutes.  Washing and waxing a car for 45 to 60  minutes.  Playing touch football for 45 minutes.  Walking 1  miles in 35 minutes.  Pushing a stroller 1  miles in 30 minutes.  Playing basketball for 30 minutes.  Raking leaves for 30 minutes.  Bicycling 5 miles in 30 minutes.  Walking 2 miles in 30 minutes.  Dancing for 30 minutes.  Shoveling snow for 15 minutes.  Swimming laps for 20 minutes.  Walking up stairs for 15 minutes.  Bicycling 4 miles in 15 minutes.  Gardening for 30 to 45 minutes.  Jumping rope for 15 minutes.  Washing windows or floors for 45 to 60 minutes. Document Released: 05/29/2010 Document Revised: 07/19/2011 Document Reviewed: 05/29/2010 ExitCare Patient Information 2013 [employer].   Other topics ( that may be useful information):    Sexually Transmitted Disease Sexually transmitted disease (STD) refers to any infection that is passed from person to person during sexual activity. This may happen by way of saliva, semen, blood, vaginal mucus, or urine. Common STDs include:  Gonorrhea.  Chlamydia.  Syphilis.  HIV/AIDS.  Genital herpes.  Hepatitis B and C.  Trichomonas.  Human papillomavirus (HPV).  Pubic lice. CAUSES  An STD may be spread by bacteria, virus, or parasite. A person can get an STD by:  Sexual intercourse with an infected person.  Sharing sex toys with an infected person.  Sharing needles with an infected person.  Having intimate contact with the genitals, mouth, or rectal areas of an infected person. SYMPTOMS  Some people may not have any symptoms, but   they can still pass the infection to others. Different STDs have different symptoms. Symptoms include:  Painful or bloody urination.  Pain in the pelvis, abdomen, vagina, anus, throat, or eyes.  Skin rash, itching, irritation, growths, or sores (lesions). These usually occur in the genital or anal area.  Abnormal vaginal discharge.  Penile discharge in men.  Soft, flesh-colored skin growths in the  genital or anal area.  Fever.  Pain or bleeding during sexual intercourse.  Swollen glands in the groin area.  Yellow skin and eyes (jaundice). This is seen with hepatitis. DIAGNOSIS  To make a diagnosis, your caregiver may:  Take a medical history.  Perform a physical exam.  Take a specimen (culture) to be examined.  Examine a sample of discharge under a microscope.  Perform blood test TREATMENT   Chlamydia, gonorrhea, trichomonas, and syphilis can be cured with antibiotic medicine.  Genital herpes, hepatitis, and HIV can be treated, but not cured, with prescribed medicines. The medicines will lessen the symptoms.  Genital warts from HPV can be treated with medicine or by freezing, burning (electrocautery), or surgery. Warts may come back.  HPV is a virus and cannot be cured with medicine or surgery.However, abnormal areas may be followed very closely by your caregiver and may be removed from the cervix, vagina, or vulva through office procedures or surgery. If your diagnosis is confirmed, your recent sexual partners need treatment. This is true even if they are symptom-free or have a negative culture or evaluation. They should not have sex until their caregiver says it is okay. HOME CARE INSTRUCTIONS  All sexual partners should be informed, tested, and treated for all STDs.  Take your antibiotics as directed. Finish them even if you start to feel better.  Only take over-the-counter or prescription medicines for pain, discomfort, or fever as directed by your caregiver.  Rest.  Eat a balanced diet and drink enough fluids to keep your urine clear or pale yellow.  Do not have sex until treatment is completed and you have followed up with your caregiver. STDs should be checked after treatment.  Keep all follow-up appointments, Pap tests, and blood tests as directed by your caregiver.  Only use latex condoms and water-soluble lubricants during sexual activity. Do not use  petroleum jelly or oils.  Avoid alcohol and illegal drugs.  Get vaccinated for HPV and hepatitis. If you have not received these vaccines in the past, talk to your caregiver about whether one or both might be right for you.  Avoid risky sex practices that can break the skin. The only way to avoid getting an STD is to avoid all sexual activity.Latex condoms and dental dams (for oral sex) will help lessen the risk of getting an STD, but will not completely eliminate the risk. SEEK MEDICAL CARE IF:   You have a fever.  You have any new or worsening symptoms. Document Released: 07/17/2002 Document Revised: 07/19/2011 Document Reviewed: 07/24/2010 [facility] Patient Information 2013 [place].    Domestic Abuse You are being battered or abused if someone close to you hits, pushes, or physically hurts you in any way. You also are being abused if you are forced into activities. You are being sexually abused if you are forced to have sexual contact of any kind. You are being emotionally abused if you are made to feel worthless or if you are constantly threatened. It is important to remember that help is available. No one has the right to abuse you. PREVENTION OF FURTHER  ABUSE  Learn the warning signs of danger. This varies with situations but may include: the use of alcohol, threats, isolation from friends and family, or forced sexual contact. Leave if you feel that violence is going to occur.  If you are attacked or beaten, report it to the police so the abuse is documented. You do not have to press charges. The police can protect you while you or the attackers are leaving. Get the officer's name and badge number and a copy of the report.  Find someone you can trust and tell them what is happening to you: your caregiver, a nurse, clergy member, close friend or family member. Feeling ashamed is natural, but remember that you have done nothing wrong. No one deserves abuse. Document Released:  04/23/2000 Document Revised: 07/19/2011 Document Reviewed: 07/02/2010 ExitCare Patient Information 2013 [employer].    How Much is Too Much Alcohol? Drinking too much alcohol can cause injury, accidents, and health problems. These types of problems can include:   Car crashes.  Falls.  Family fighting (domestic violence).  Drowning.  Fights.  Injuries.  Burns.  Damage to certain organs.  Having a baby with birth defects. ONE DRINK CAN BE TOO MUCH WHEN YOU ARE:  Working.  Pregnant or breastfeeding.  Taking medicines. Ask your doctor.  Driving or planning to drive. If you or someone you know has a drinking problem, get help from a doctor.  Document Released: 02/20/2009 Document Revised: 07/19/2011 Document Reviewed: 02/20/2009 ExitCare Patient Information 2013 [employer].   Smoking Hazards Smoking cigarettes is extremely bad for your health. Tobacco smoke has over 200 known poisons in it. There are over 60 chemicals in tobacco smoke that cause cancer. Some of the chemicals found in cigarette smoke include:   Cyanide.  Benzene.  Formaldehyde.  Methanol (wood alcohol).  Acetylene (fuel used in welding torches).  Ammonia. Cigarette smoke also contains the poisonous gases nitrogen oxide and carbon monoxide.  Cigarette smokers have an increased risk of many serious medical problems and Smoking causes approximately:  90% of all lung cancer deaths in men.  80% of all lung cancer deaths in women.  90% of deaths from chronic obstructive lung disease. Compared with nonsmokers, smoking increases the risk of:  Coronary heart disease by 2 to 4 times.  Stroke by 2 to 4 times.  Men developing lung cancer by 23 times.  Women developing lung cancer by 13 times.  Dying from chronic obstructive lung diseases by 12 times.  . Smoking is the most preventable cause of death and disease in our society.  WHY IS SMOKING ADDICTIVE?  Nicotine is the chemical  agent in tobacco that is capable of causing addiction or dependence.  When you smoke and inhale, nicotine is absorbed rapidly into the bloodstream through your lungs. Nicotine absorbed through the lungs is capable of creating a powerful addiction. Both inhaled and non-inhaled nicotine may be addictive.  Addiction studies of cigarettes and spit tobacco show that addiction to nicotine occurs mainly during the teen years, when young people begin using tobacco products. WHAT ARE THE BENEFITS OF QUITTING?  There are many health benefits to quitting smoking.   Likelihood of developing cancer and heart disease decreases. Health improvements are seen almost immediately.  Blood pressure, pulse rate, and breathing patterns start returning to normal soon after quitting. QUITTING SMOKING   American Lung Association - 1-800-LUNGUSA  American Cancer Society - 1-800-ACS-2345 Document Released: 06/03/2004 Document Revised: 07/19/2011 Document Reviewed: 02/05/2009 ExitCare Patient Information 2013 ExitCare,   LLC.   Stress Management Stress is a state of physical or mental tension that often results from changes in your life or normal routine. Some common causes of stress are:  Death of a loved one.  Injuries or severe illnesses.  Getting fired or changing jobs.  Moving into a new home. Other causes may be:  Sexual problems.  Business or financial losses.  Taking on a large debt.  Regular conflict with someone at home or at work.  Constant tiredness from lack of sleep. It is not just bad things that are stressful. It may be stressful to:  Win the lottery.  Get married.  Buy a new car. The amount of stress that can be easily tolerated varies from person to person. Changes generally cause stress, regardless of the types of change. Too much stress can affect your health. It may lead to physical or emotional problems. Too little stress (boredom) may also become stressful. SUGGESTIONS TO  REDUCE STRESS:  Talk things over with your family and friends. It often is helpful to share your concerns and worries. If you feel your problem is serious, you may want to get help from a professional counselor.  Consider your problems one at a time instead of lumping them all together. Trying to take care of everything at once may seem impossible. List all the things you need to do and then start with the most important one. Set a goal to accomplish 2 or 3 things each day. If you expect to do too many in a single day you will naturally fail, causing you to feel even more stressed.  Do not use alcohol or drugs to relieve stress. Although you may feel better for a short time, they do not remove the problems that caused the stress. They can also be habit forming.  Exercise regularly - at least 3 times per week. Physical exercise can help to relieve that "uptight" feeling and will relax you.  The shortest distance between despair and hope is often a good night's sleep.  Go to bed and get up on time allowing yourself time for appointments without being rushed.  Take a short "time-out" period from any stressful situation that occurs during the day. Close your eyes and take some deep breaths. Starting with the muscles in your face, tense them, hold it for a few seconds, then relax. Repeat this with the muscles in your neck, shoulders, hand, stomach, back and legs.  Take good care of yourself. Eat a balanced diet and get plenty of rest.  Schedule time for having fun. Take a break from your daily routine to relax. HOME CARE INSTRUCTIONS   Call if you feel overwhelmed by your problems and feel you can no longer manage them on your own.  Return immediately if you feel like hurting yourself or someone else. Document Released: 10/20/2000 Document Revised: 07/19/2011 Document Reviewed: 06/12/2007 ExitCare Patient Information 2013 [employer].   

## 2018-01-13 ENCOUNTER — Other Ambulatory Visit: Payer: 59

## 2018-01-25 ENCOUNTER — Other Ambulatory Visit: Payer: Self-pay | Admitting: Obstetrics & Gynecology

## 2018-01-25 ENCOUNTER — Ambulatory Visit
Admission: RE | Admit: 2018-01-25 | Discharge: 2018-01-25 | Disposition: A | Discharge status: Home / Self Care | Payer: 59 | Source: Ambulatory Visit | Attending: Obstetrics & Gynecology | Admitting: Obstetrics & Gynecology

## 2018-01-25 DIAGNOSIS — N6320 Unspecified lump in the left breast, unspecified quadrant: Secondary | ICD-10-CM

## 2018-07-31 ENCOUNTER — Other Ambulatory Visit: Payer: 59

## 2018-09-15 ENCOUNTER — Ambulatory Visit: Payer: 59 | Admitting: Certified Nurse Midwife

## 2018-11-06 ENCOUNTER — Telehealth: Payer: Self-pay | Admitting: Certified Nurse Midwife

## 2018-11-06 NOTE — Telephone Encounter (Signed)
Spoke with patient. She states she was in contact with someone who has tested positive for Covid and wants testing. Advised patient she can have this done at [facility] and she was given their phone # [phone] to schedule testing. Routed to provider.

## 2018-11-06 NOTE — Telephone Encounter (Signed)
Patient's mother is calling regarding patient being exposed to [place]. Patient was in contact with someone who has been confirmed positive. Patient is requesting orders for COVID testing.

## 2018-11-07 NOTE — Telephone Encounter (Signed)
Encounter closed

## 2018-11-07 NOTE — Telephone Encounter (Signed)
Agree with plan 

## 2018-11-25 ENCOUNTER — Other Ambulatory Visit: Payer: Self-pay | Admitting: Certified Nurse Midwife

## 2018-11-25 DIAGNOSIS — Z3041 Encounter for surveillance of contraceptive pills: Secondary | ICD-10-CM

## 2018-12-06 ENCOUNTER — Other Ambulatory Visit: Payer: Self-pay | Admitting: Certified Nurse Midwife

## 2018-12-06 DIAGNOSIS — Z3041 Encounter for surveillance of contraceptive pills: Secondary | ICD-10-CM

## 2018-12-07 ENCOUNTER — Other Ambulatory Visit: Payer: Self-pay | Admitting: Certified Nurse Midwife

## 2018-12-07 DIAGNOSIS — Z3041 Encounter for surveillance of contraceptive pills: Secondary | ICD-10-CM

## 2018-12-07 NOTE — Telephone Encounter (Signed)
Medication refill request: Loryna Last AEX:  09/02/17 Next AEX: 12/27/18 Last MMG (if hormonal medication request): NA Refill authorized: 1 Pack with 0 rf to get her to her appointment.

## 2018-12-07 NOTE — Telephone Encounter (Signed)
Patient is calling regarding medication refill for Lorynia. Patient scheduled AEX at time of call.

## 2018-12-08 MED ORDER — LORYNA 3-0.02 MG PO TABS: 1.0000 | ORAL_TABLET | Freq: Every day | ORAL | 0 refills | Status: DC

## 2018-12-27 ENCOUNTER — Other Ambulatory Visit (HOSPITAL_COMMUNITY)
Admission: RE | Admit: 2018-12-27 | Discharge: 2018-12-27 | Disposition: A | Discharge status: Home / Self Care | Payer: 59 | Source: Ambulatory Visit | Attending: Obstetrics & Gynecology | Admitting: Obstetrics & Gynecology

## 2018-12-27 ENCOUNTER — Encounter: Payer: Self-pay | Admitting: Certified Nurse Midwife

## 2018-12-27 ENCOUNTER — Ambulatory Visit: Payer: 59 | Admitting: Certified Nurse Midwife

## 2018-12-27 ENCOUNTER — Other Ambulatory Visit: Payer: Self-pay

## 2018-12-27 VITALS — BP 110/70 | HR 70 | Temp 97.3°F | Resp 16 | Ht 68.25 in | Wt 159.0 lb

## 2018-12-27 DIAGNOSIS — Z124 Encounter for screening for malignant neoplasm of cervix: Secondary | ICD-10-CM

## 2018-12-27 DIAGNOSIS — Z3041 Encounter for surveillance of contraceptive pills: Secondary | ICD-10-CM | POA: Diagnosis not present

## 2018-12-27 DIAGNOSIS — Z113 Encounter for screening for infections with a predominantly sexual mode of transmission: Secondary | ICD-10-CM

## 2018-12-27 DIAGNOSIS — Z01419 Encounter for gynecological examination (general) (routine) without abnormal findings: Secondary | ICD-10-CM

## 2018-12-27 MED ORDER — LORYNA 3-0.02 MG PO TABS: 1.0000 | ORAL_TABLET | Freq: Every day | ORAL | 4 refills | Status: DC

## 2018-12-27 NOTE — Patient Instructions (Signed)
General topics  Next pap or exam is  due in 1 year Take a Women's multivitamin Take 1200 mg. of calcium daily - prefer dietary If any concerns in interim to call back  Breast Self-Awareness Practicing breast self-awareness may pick up problems early, prevent significant medical complications, and possibly save your life. By practicing breast self-awareness, you can become familiar with how your breasts look and feel and if your breasts are changing. This allows you to notice changes early. It can also offer you some reassurance that your breast health is good. One way to learn what is normal for your breasts and whether your breasts are changing is to do a breast self-exam. If you find a lump or something that was not present in the past, it is best to contact your caregiver right away. Other findings that should be evaluated by your caregiver include nipple discharge, especially if it is bloody; skin changes or reddening; areas where the skin seems to be pulled in (retracted); or new lumps and bumps. Breast pain is seldom associated with cancer (malignancy), but should also be evaluated by a caregiver. BREAST SELF-EXAM The best time to examine your breasts is 5 7 days after your menstrual period is over.  ExitCare Patient Information 2013 [place].   Exercise to Stay Healthy Exercise helps you become and stay healthy. EXERCISE IDEAS AND TIPS Choose exercises that:  You enjoy.  Fit into your day. You do not need to exercise really hard to be healthy. You can do exercises at a slow or medium level and stay healthy. You can:  Stretch before and after working out.  Try yoga, Pilates, or tai chi.  Lift weights.  Walk fast, swim, jog, run, climb stairs, bicycle, dance, or rollerskate.  Take aerobic classes. Exercises that burn about 150 calories:  Running 1  miles in 15 minutes.  Playing volleyball for 45 to 60 minutes.  Washing and waxing a car for 45 to 60  minutes.  Playing touch football for 45 minutes.  Walking 1  miles in 35 minutes.  Pushing a stroller 1  miles in 30 minutes.  Playing basketball for 30 minutes.  Raking leaves for 30 minutes.  Bicycling 5 miles in 30 minutes.  Walking 2 miles in 30 minutes.  Dancing for 30 minutes.  Shoveling snow for 15 minutes.  Swimming laps for 20 minutes.  Walking up stairs for 15 minutes.  Bicycling 4 miles in 15 minutes.  Gardening for 30 to 45 minutes.  Jumping rope for 15 minutes.  Washing windows or floors for 45 to 60 minutes. Document Released: 05/29/2010 Document Revised: 07/19/2011 Document Reviewed: 05/29/2010 [facility] Patient Information 2013 [place].   Other topics ( that may be useful information):    Sexually Transmitted Disease Sexually transmitted disease (STD) refers to any infection that is passed from person to person during sexual activity. This may happen by way of saliva, semen, blood, vaginal mucus, or urine. Common STDs include:  Gonorrhea.  Chlamydia.  Syphilis.  HIV/AIDS.  Genital herpes.  Hepatitis B and C.  Trichomonas.  Human papillomavirus (HPV).  Pubic lice. CAUSES  An STD may be spread by bacteria, virus, or parasite. A person can get an STD by:  Sexual intercourse with an infected person.  Sharing sex toys with an infected person.  Sharing needles with an infected person.  Having intimate contact with the genitals, mouth, or rectal areas of an infected person. SYMPTOMS  Some people may  they can still pass the infection to others. Different STDs have different symptoms. Symptoms include: °· Painful or bloody urination. °· Pain in the pelvis, abdomen, vagina, anus, throat, or eyes. °· Skin rash, itching, irritation, growths, or sores (lesions). These usually occur in the genital or anal area. °· Abnormal vaginal discharge. °· Penile discharge in men. °· Soft, flesh-colored skin growths in the  genital or anal area. °· Fever. °· Pain or bleeding during sexual intercourse. °· Swollen glands in the groin area. °· Yellow skin and eyes (jaundice). This is seen with hepatitis. °DIAGNOSIS  °To make a diagnosis, your caregiver may: °· Take a medical history. °· Perform a physical exam. °· Take a specimen (culture) to be examined. °· Examine a sample of discharge under a microscope. °· Perform blood test °TREATMENT  °· Chlamydia, gonorrhea, trichomonas, and syphilis can be cured with antibiotic medicine. °· Genital herpes, hepatitis, and HIV can be treated, but not cured, with prescribed medicines. The medicines will lessen the symptoms. °· Genital warts from HPV can be treated with medicine or by freezing, burning (electrocautery), or surgery. Warts may come back. °· HPV is a virus and cannot be cured with medicine or surgery. However, abnormal areas may be followed very closely by your caregiver and may be removed from the cervix, vagina, or vulva through office procedures or surgery. °If your diagnosis is confirmed, your recent sexual partners need treatment. This is true even if they are symptom-free or have a negative culture or evaluation. They should not have sex until their caregiver says it is okay. °HOME CARE INSTRUCTIONS °· All sexual partners should be informed, tested, and treated for all STDs. °· Take your antibiotics as directed. Finish them even if you start to feel better. °· Only take over-the-counter or prescription medicines for pain, discomfort, or fever as directed by your caregiver. °· Rest. °· Eat a balanced diet and drink enough fluids to keep your urine clear or pale yellow. °· Do not have sex until treatment is completed and you have followed up with your caregiver. STDs should be checked after treatment. °· Keep all follow-up appointments, Pap tests, and blood tests as directed by your caregiver. °· Only use latex condoms and water-soluble lubricants during sexual activity. Do not use  petroleum jelly or oils. °· Avoid alcohol and illegal drugs. °· Get vaccinated for HPV and hepatitis. If you have not received these vaccines in the past, talk to your caregiver about whether one or both might be right for you. °· Avoid risky sex practices that can break the skin. °The only way to avoid getting an STD is to avoid all sexual activity. Latex condoms and dental dams (for oral sex) will help lessen the risk of getting an STD, but will not completely eliminate the risk. °SEEK MEDICAL CARE IF:  °· You have a fever. °· You have any new or worsening symptoms. °Document Released: 07/17/2002 Document Revised: 07/19/2011 Document Reviewed: 07/24/2010 °ExitCare® Patient Information ©2013 [employer]. ° ° ° °Domestic Abuse °You are being battered or abused if someone close to you hits, pushes, or physically hurts you in any way. You also are being abused if you are forced into activities. You are being sexually abused if you are forced to have sexual contact of any kind. You are being emotionally abused if you are made to feel worthless or if you are constantly threatened. It is important to remember that help is available. No one has the right to abuse you. °PREVENTION OF FURTHER   abuse you. PREVENTION OF FURTHER ABUSE  Learn the warning signs of danger. This varies with situations but may include: the use of alcohol, threats, isolation from friends and family, or forced sexual contact. Leave if you feel that violence is going to occur.  If you are attacked or beaten, report it to the police so the abuse is documented. You do not have to press charges. The police can protect you while you or the attackers are leaving. Get the officer's name and badge number and a copy of the report.  Find someone you can trust and tell them what is happening to you: your caregiver, a nurse, clergy member, close friend or family member. Feeling ashamed is natural, but remember that you have done nothing wrong. No one deserves abuse. Document Released:  04/23/2000 Document Revised: 07/19/2011 Document Reviewed: 07/02/2010 [facility] Patient Information 2013 [place].    How Much is Too Much Alcohol? Drinking too much alcohol can cause injury, accidents, and health problems. These types of problems can include:   Car crashes.  Falls.  Family fighting (domestic violence).  Drowning.  Fights.  Injuries.  Burns.  Damage to certain organs.  Having a baby with birth defects. ONE DRINK CAN BE TOO MUCH WHEN YOU ARE:  Working.  Pregnant or breastfeeding.  Taking medicines. Ask your doctor.  Driving or planning to drive. If you or someone you know has a drinking problem, get help from a doctor.  Document Released: 02/20/2009 Document Revised: 07/19/2011 Document Reviewed: 02/20/2009 [facility] Patient Information 2013 [place].   Smoking Hazards Smoking cigarettes is extremely bad for your health. Tobacco smoke has over 200 known poisons in it. There are over 60 chemicals in tobacco smoke that cause cancer. Some of the chemicals found in cigarette smoke include:   Cyanide.  Benzene.  Formaldehyde.  Methanol (wood alcohol).  Acetylene (fuel used in welding torches).  Ammonia. Cigarette smoke also contains the poisonous gases nitrogen oxide and carbon monoxide.  Cigarette smokers have an increased risk of many serious medical problems and Smoking causes approximately:  90% of all lung cancer deaths in men.  80% of all lung cancer deaths in women.  90% of deaths from chronic obstructive lung disease. Compared with nonsmokers, smoking increases the risk of:  Coronary heart disease by 2 to 4 times.  Stroke by 2 to 4 times.  Men developing lung cancer by 23 times.  Women developing lung cancer by 13 times.  Dying from chronic obstructive lung diseases by 12 times.  . Smoking is the most preventable cause of death and disease in our society.  WHY IS SMOKING ADDICTIVE?  Nicotine is the chemical  agent in tobacco that is capable of causing addiction or dependence.  When you smoke and inhale, nicotine is absorbed rapidly into the bloodstream through your lungs. Nicotine absorbed through the lungs is capable of creating a powerful addiction. Both inhaled and non-inhaled nicotine may be addictive.  Addiction studies of cigarettes and spit tobacco show that addiction to nicotine occurs mainly during the teen years, when young people begin using tobacco products. WHAT ARE THE BENEFITS OF QUITTING?  There are many health benefits to quitting smoking.   Likelihood of developing cancer and heart disease decreases. Health improvements are seen almost immediately.  Blood pressure, pulse rate, and breathing patterns start returning to normal soon after quitting. QUITTING SMOKING   American Lung Association - 1-800-LUNGUSA  American Cancer Society - 1-800-ACS-2345 Document Released: 06/03/2004 Document Revised: 07/19/2011 Document Reviewed: 02/05/2009  ExitCare Patient Information 2013 [place].   Stress Management Stress is a state of physical or mental tension that often results from changes in your life or normal routine. Some common causes of stress are:  Death of a loved one.  Injuries or severe illnesses.  Getting fired or changing jobs.  Moving into a new home. Other causes may be:  Sexual problems.  Business or financial losses.  Taking on a large debt.  Regular conflict with someone at home or at work.  Constant tiredness from lack of sleep. It is not just bad things that are stressful. It may be stressful to:  Win the lottery.  Get married.  Buy a new car. The amount of stress that can be easily tolerated varies from person to person. Changes generally cause stress, regardless of the types of change. Too much stress can affect your health. It may lead to physical or emotional problems. Too little stress (boredom) may also become stressful. SUGGESTIONS TO  REDUCE STRESS:  Talk things over with your family and friends. It often is helpful to share your concerns and worries. If you feel your problem is serious, you may want to get help from a professional counselor.  Consider your problems one at a time instead of lumping them all together. Trying to take care of everything at once may seem impossible. List all the things you need to do and then start with the most important one. Set a goal to accomplish 2 or 3 things each day. If you expect to do too many in a single day you will naturally fail, causing you to feel even more stressed.  Do not use alcohol or drugs to relieve stress. Although you may feel better for a short time, they do not remove the problems that caused the stress. They can also be habit forming.  Exercise regularly - at least 3 times per week. Physical exercise can help to relieve that "uptight" feeling and will relax you.  The shortest distance between despair and hope is often a good night's sleep.  Go to bed and get up on time allowing yourself time for appointments without being rushed.  Take a short "time-out" period from any stressful situation that occurs during the day. Close your eyes and take some deep breaths. Starting with the muscles in your face, tense them, hold it for a few seconds, then relax. Repeat this with the muscles in your neck, shoulders, hand, stomach, back and legs.  Take good care of yourself. Eat a balanced diet and get plenty of rest.  Schedule time for having fun. Take a break from your daily routine to relax. HOME CARE INSTRUCTIONS   Call if you feel overwhelmed by your problems and feel you can no longer manage them on your own.  Return immediately if you feel like hurting yourself or someone else. Document Released: 10/20/2000 Document Revised: 07/19/2011 Document Reviewed: 06/12/2007 [facility] Patient Information 2013 [place].
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
## 2018-12-28 LAB — CYTOLOGY - PAP
Chlamydia: NEGATIVE
Diagnosis: NEGATIVE
Neisseria Gonorrhea: NEGATIVE

## 2018-12-28 LAB — VAGINITIS/VAGINOSIS, DNA PROBE
Candida Species: NEGATIVE
Gardnerella vaginalis: NEGATIVE
Trichomonas vaginosis: NEGATIVE

## 2018-12-28 LAB — RPR: RPR Ser Ql: NONREACTIVE

## 2018-12-28 LAB — HIV ANTIBODY (ROUTINE TESTING W REFLEX): HIV Screen 4th Generation wRfx: NONREACTIVE

## 2018-12-28 LAB — HEPATITIS C ANTIBODY: Hep C Virus Ab: <0.1 s/co ratio (ref 0.0–0.9)

## 2019-02-28 IMAGING — US ULTRASOUND LEFT BREAST LIMITED
1 series · 6 of 6 positions shown · non-contrast
Comparison: Previous exam(s).

CLINICAL DATA: 21-year-old female for follow-up of LEFT breast
mass.

EXAM:
ULTRASOUND OF THE LEFT BREAST

[Series 1: ultrasound left breast limited · 0.06mm/px · 6 of 6 slices shown]
[im 1/6]
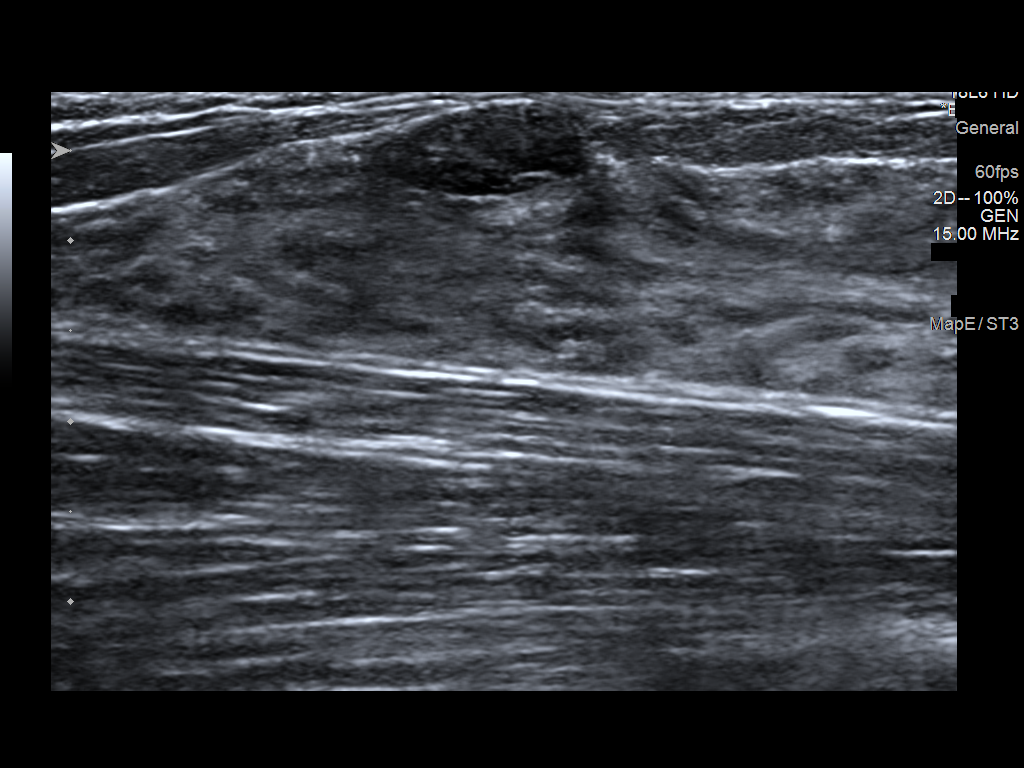
[im 2/6]
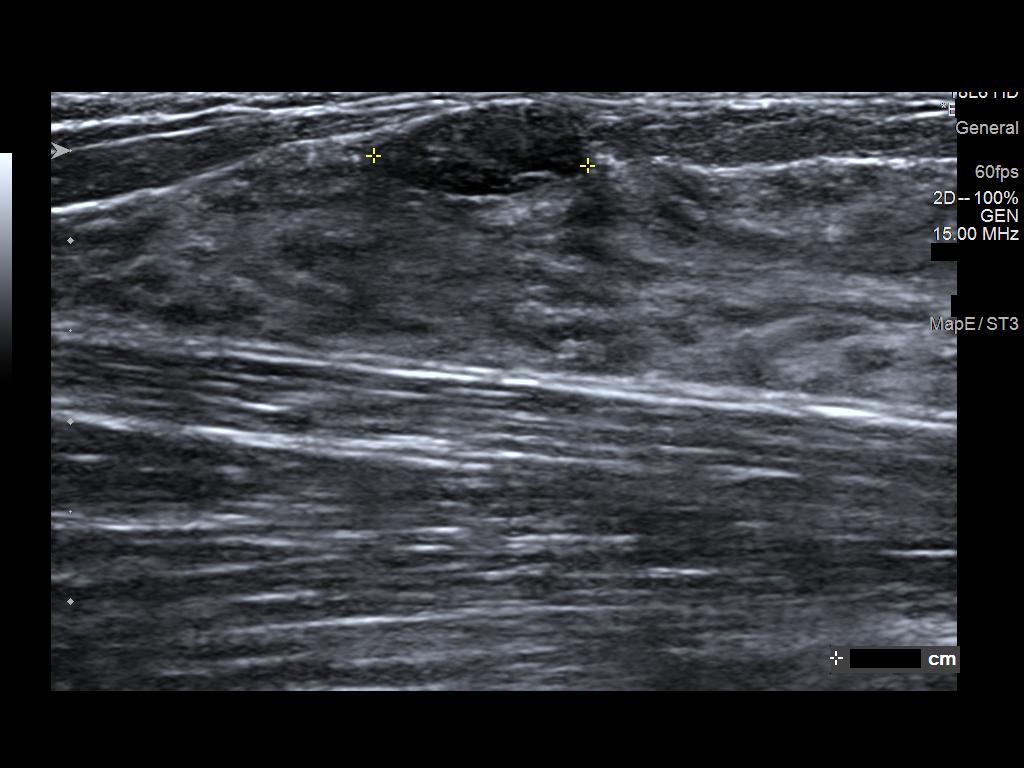
[im 3/6]
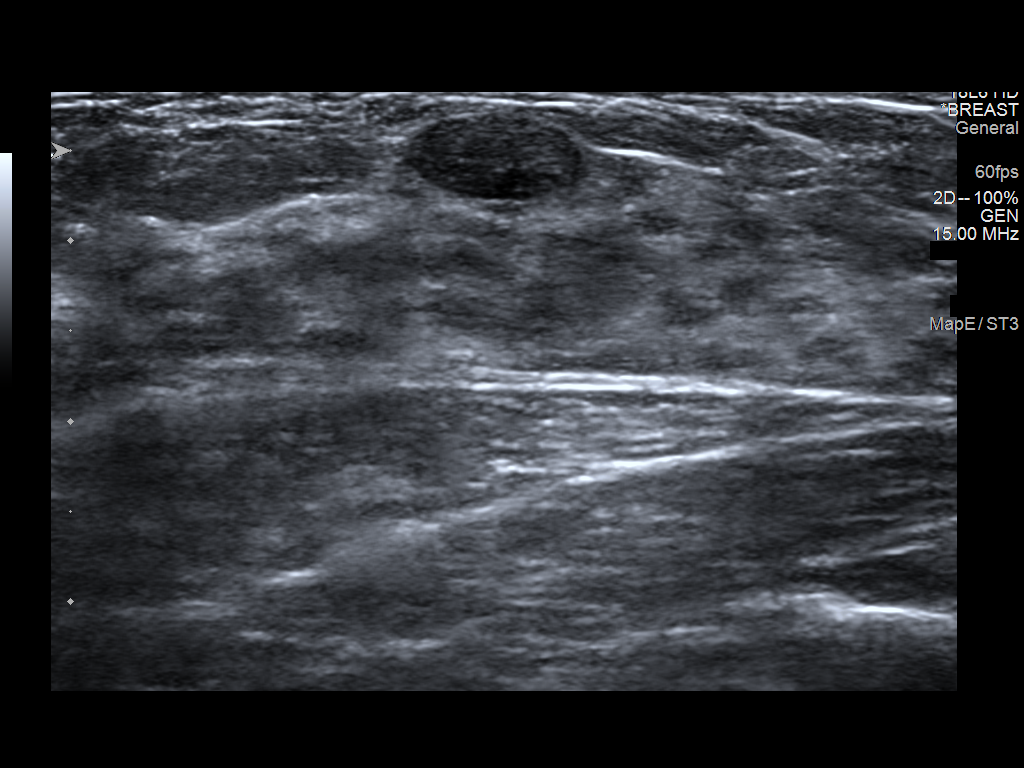
[im 4/6]
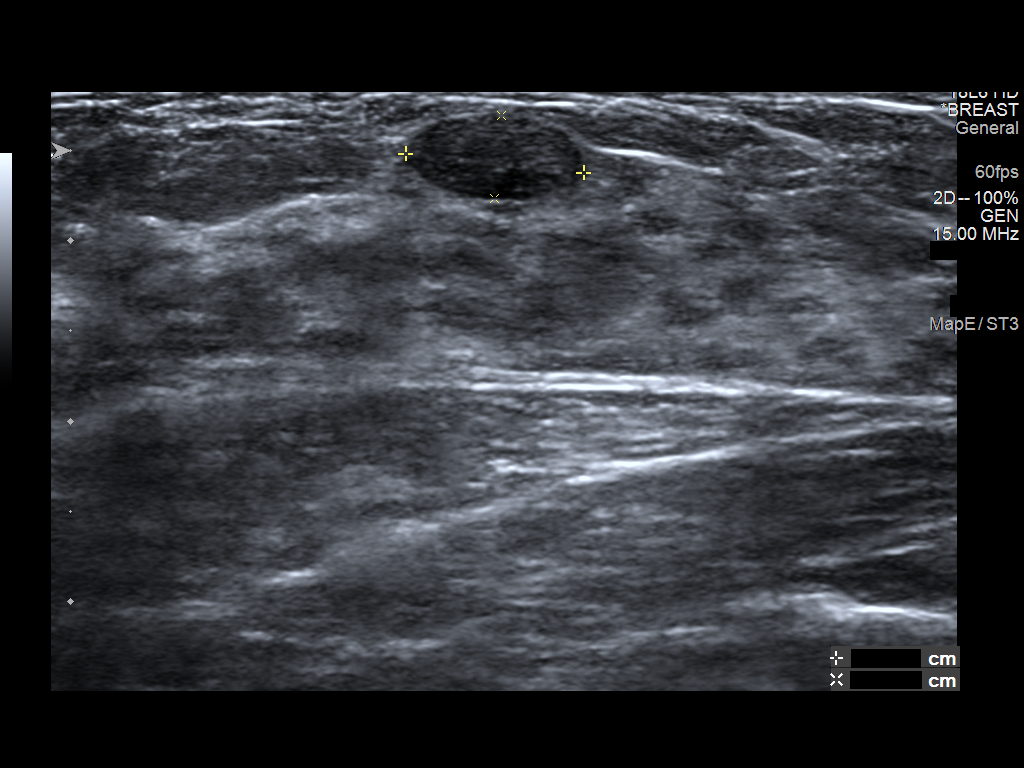
[im 5/6]
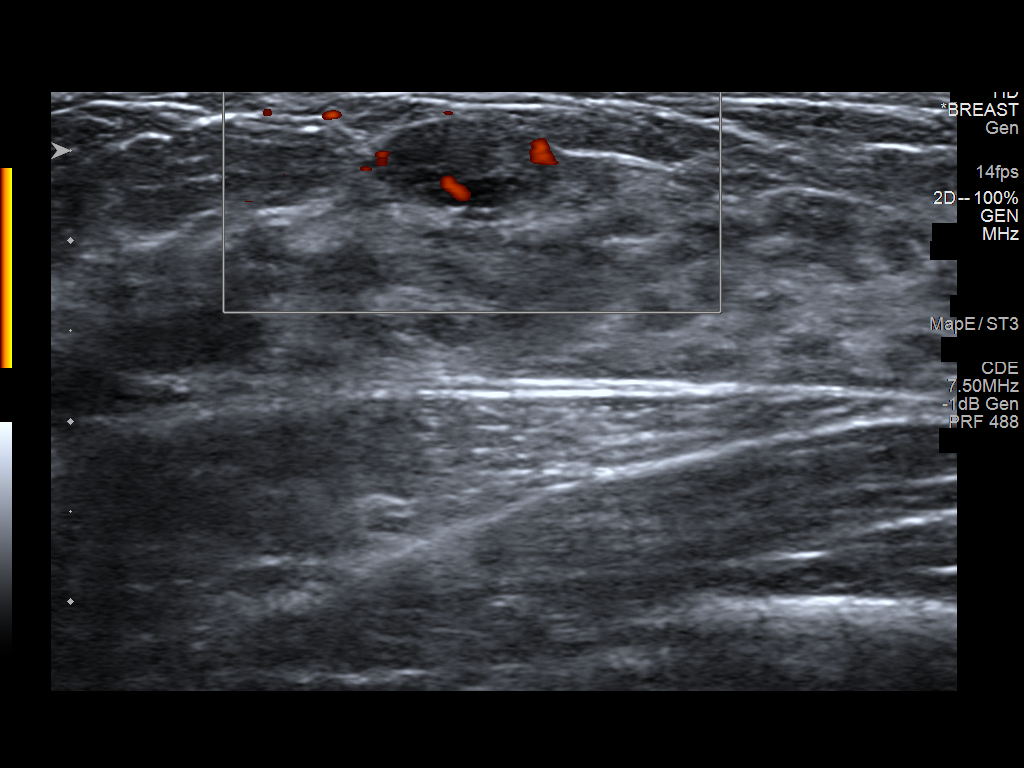
[im 6/6]
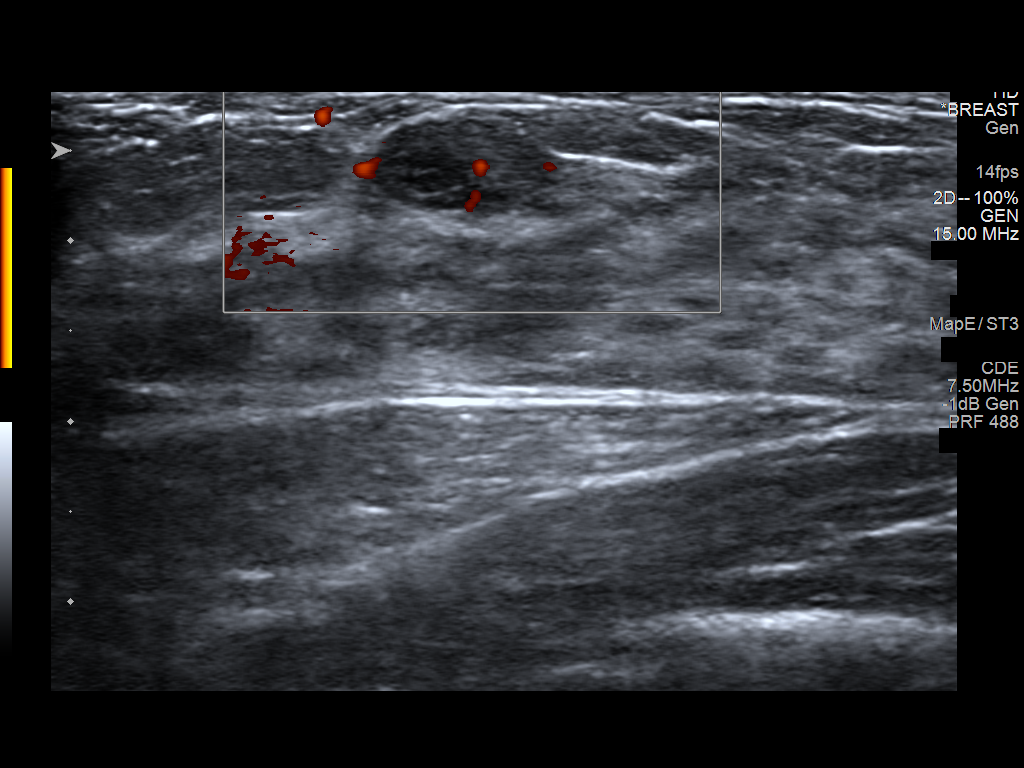

[6 of 6 positions shown; findings below may reference images not displayed]

FINDINGS: Targeted ultrasound is performed, showing a stable 1 x 0.5 x 1.2 cm
circumscribed oval hypoechoic parallel mass at the [DATE] position of
the LEFT breast 6 cm from the nipple, likely representing a
fibroadenoma.
IMPRESSION: Stable probable fibroadenoma within the UPPER-OUTER LEFT breast.
Six-month followup is recommended to ensure stability.

RECOMMENDATION:
LEFT breast ultrasound in 6 months.

I have discussed the findings and recommendations with the patient.
Results were also provided in writing at the conclusion of the
visit. If applicable, a reminder letter will be sent to the patient
regarding the next appointment.

BI-RADS CATEGORY  3: Probably benign.

## 2019-03-28 ENCOUNTER — Ambulatory Visit: Payer: 59 | Admitting: Certified Nurse Midwife

## 2019-04-02 ENCOUNTER — Telehealth: Payer: Self-pay

## 2019-04-02 NOTE — Telephone Encounter (Signed)
No answer. No voicemail set up. Will try call again later.

## 2019-04-02 NOTE — Telephone Encounter (Signed)
-----   Message from Regina Eck, CNM sent at 04/01/2019  3:57 PM EST ----- Regarding: breast Ultrasound Follow up Please call patient and let her know she has not completed her follow up US on left breast felt to be be fibroadenoma. I strongly recommend she do this to make sure no changes. If no changes likely no further follow up other than at aex will be the recommendation. If she does not plan to do this will not in her chart.

## 2019-04-03 NOTE — Telephone Encounter (Signed)
No answer.  No voicemail setup

## 2019-04-04 NOTE — Telephone Encounter (Signed)
No voicemail set up 

## 2019-04-09 ENCOUNTER — Other Ambulatory Visit: Payer: Self-pay | Admitting: Obstetrics & Gynecology

## 2019-04-09 DIAGNOSIS — N63 Unspecified lump in unspecified breast: Secondary | ICD-10-CM

## 2019-04-09 NOTE — Telephone Encounter (Signed)
Patient notified that she needs to have follow up u/s done. Patient agrees to call & make her appt. Phone number given. Patient aware of importance of having it done.

## 2019-04-09 NOTE — Telephone Encounter (Signed)
Please close encounter when done.

## 2019-04-18 ENCOUNTER — Ambulatory Visit
Admission: RE | Admit: 2019-04-18 | Discharge: 2019-04-18 | Disposition: A | Discharge status: Home / Self Care | Payer: 59 | Source: Ambulatory Visit | Attending: Obstetrics & Gynecology | Admitting: Obstetrics & Gynecology

## 2019-04-18 ENCOUNTER — Other Ambulatory Visit: Payer: Self-pay

## 2019-04-18 DIAGNOSIS — N63 Unspecified lump in unspecified breast: Secondary | ICD-10-CM

## 2019-07-23 ENCOUNTER — Encounter: Payer: Self-pay | Admitting: Certified Nurse Midwife

## 2019-07-25 ENCOUNTER — Encounter: Payer: Self-pay | Admitting: Certified Nurse Midwife

## 2019-11-28 ENCOUNTER — Telehealth: Payer: Self-pay

## 2019-11-28 NOTE — Telephone Encounter (Signed)
Spoke with patient. Patient is requesting RX for UTI symptoms. Reports dysuria, urinary frequency and urgency, voids small amounts, blood when wiping. Reports menses are regular on OCP. Symptoms started  2 days ago. Denies fever/chills, N/V, flank pain, vag d/c or odor. Patient now lives in [place], declines [place].  Advised patient to go to nearest urgent care/ER or PCP in her area for further evaluation. Patient verbalizes understanding and is agreeable.   Last AEX w/ Melvia Heaps, CNM 12/27/18  Routing to provider for final review. Patient is agreeable to disposition. Will close encounter.

## 2019-11-28 NOTE — Telephone Encounter (Signed)
Patient is calling in regards to having UTI. Patient would like a prescription called in if possible. Patient states she has moved to [place]. [facility] [address], [place], [place].

## 2020-01-01 ENCOUNTER — Ambulatory Visit: Payer: 59 | Admitting: Certified Nurse Midwife

## 2020-03-19 ENCOUNTER — Other Ambulatory Visit: Payer: Self-pay

## 2020-03-19 DIAGNOSIS — Z3041 Encounter for surveillance of contraceptive pills: Secondary | ICD-10-CM

## 2020-03-19 MED ORDER — LORYNA 3-0.02 MG PO TABS: 1.0000 | ORAL_TABLET | Freq: Every day | ORAL | 0 refills | Status: DC

## 2020-03-19 NOTE — Telephone Encounter (Signed)
Medication refill request: Loryna 3-0.02mg   Last AEX:  12/27/18 Next AEX: 04/18/20 Last MMG (if hormonal medication request): NA Refill authorized: 84/0

## 2020-03-19 NOTE — Telephone Encounter (Signed)
Patient calling for refill on birth control. [facility] [phone]
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
## 2020-04-18 ENCOUNTER — Telehealth: Payer: Self-pay

## 2020-04-18 ENCOUNTER — Other Ambulatory Visit: Payer: Self-pay | Admitting: Nurse Practitioner

## 2020-04-18 ENCOUNTER — Other Ambulatory Visit: Payer: Self-pay

## 2020-04-18 ENCOUNTER — Ambulatory Visit: Payer: 59 | Admitting: Nurse Practitioner

## 2020-04-18 ENCOUNTER — Encounter: Payer: Self-pay | Admitting: Nurse Practitioner

## 2020-04-18 VITALS — BP 108/68 | HR 68 | Resp 16 | Ht 68.25 in | Wt 163.0 lb

## 2020-04-18 DIAGNOSIS — N6320 Unspecified lump in the left breast, unspecified quadrant: Secondary | ICD-10-CM

## 2020-04-18 DIAGNOSIS — D242 Benign neoplasm of left breast: Secondary | ICD-10-CM

## 2020-04-18 DIAGNOSIS — Z113 Encounter for screening for infections with a predominantly sexual mode of transmission: Secondary | ICD-10-CM

## 2020-04-18 DIAGNOSIS — Z01419 Encounter for gynecological examination (general) (routine) without abnormal findings: Secondary | ICD-10-CM

## 2020-04-18 DIAGNOSIS — Z3041 Encounter for surveillance of contraceptive pills: Secondary | ICD-10-CM

## 2020-04-18 MED ORDER — LORYNA 3-0.02 MG PO TABS: 1.0000 | ORAL_TABLET | Freq: Every day | ORAL | 4 refills | Status: DC

## 2020-04-18 NOTE — Patient Instructions (Signed)
Health Maintenance, Female Adopting a healthy lifestyle and getting preventive care are important in promoting health and wellness. Ask your health care provider about:  The right schedule for you to have regular tests and exams.  Things you can do on your own to prevent diseases and keep yourself healthy. What should I know about diet, weight, and exercise? Eat a healthy diet   Eat a diet that includes plenty of vegetables, fruits, low-fat dairy products, and lean protein.  Do not eat a lot of foods that are high in solid fats, added sugars, or sodium. Maintain a healthy weight Body mass index (BMI) is used to identify weight problems. It estimates body fat based on height and weight. Your health care provider can help determine your BMI and help you achieve or maintain a healthy weight. Get regular exercise Get regular exercise. This is one of the most important things you can do for your health. Most adults should:  Exercise for at least 150 minutes each week. The exercise should increase your heart rate and make you sweat (moderate-intensity exercise).  Do strengthening exercises at least twice a week. This is in addition to the moderate-intensity exercise.  Spend less time sitting. Even light physical activity can be beneficial. Watch cholesterol and blood lipids Have your blood tested for lipids and cholesterol at 26 years of age, then have this test every 5 years. Have your cholesterol levels checked more often if:  Your lipid or cholesterol levels are high.  You are older than 26 years of age.  You are at high risk for heart disease. What should I know about cancer screening? Depending on your health history and family history, you may need to have cancer screening at various ages. This may include screening for:  Breast cancer.  Cervical cancer.  Colorectal cancer.  Skin cancer.  Lung cancer. What should I know about heart disease, diabetes, and high blood  pressure? Blood pressure and heart disease  High blood pressure causes heart disease and increases the risk of stroke. This is more likely to develop in people who have high blood pressure readings, are of African descent, or are overweight.  Have your blood pressure checked: ? Every 3-5 years if you are 18-39 years of age. ? Every year if you are 40 years old or older. Diabetes Have regular diabetes screenings. This checks your fasting blood sugar level. Have the screening done:  Once every three years after age 40 if you are at a normal weight and have a low risk for diabetes.  More often and at a younger age if you are overweight or have a high risk for diabetes. What should I know about preventing infection? Hepatitis B If you have a higher risk for hepatitis B, you should be screened for this virus. Talk with your health care provider to find out if you are at risk for hepatitis B infection. Hepatitis C Testing is recommended for:  Everyone born from 1945 through 1965.  Anyone with known risk factors for hepatitis C. Sexually transmitted infections (STIs)  Get screened for STIs, including gonorrhea and chlamydia, if: ? You are sexually active and are younger than 26 years of age. ? You are older than 26 years of age and your health care provider tells you that you are at risk for this type of infection. ? Your sexual activity has changed since you were last screened, and you are at increased risk for chlamydia or gonorrhea. Ask your health care provider if   you are at risk.  Ask your health care provider about whether you are at high risk for HIV. Your health care provider may recommend a prescription medicine to help prevent HIV infection. If you choose to take medicine to prevent HIV, you should first get tested for HIV. You should then be tested every 3 months for as long as you are taking the medicine. Pregnancy  If you are about to stop having your period (premenopausal) and  you may become pregnant, seek counseling before you get pregnant.  Take 400 to 800 micrograms (mcg) of folic acid every day if you become pregnant.  Ask for birth control (contraception) if you want to prevent pregnancy. Osteoporosis and menopause Osteoporosis is a disease in which the bones lose minerals and strength with aging. This can result in bone fractures. If you are 65 years old or older, or if you are at risk for osteoporosis and fractures, ask your health care provider if you should:  Be screened for bone loss.  Take a calcium or vitamin D supplement to lower your risk of fractures.  Be given hormone replacement therapy (HRT) to treat symptoms of menopause. Follow these instructions at home: Lifestyle  Do not use any products that contain nicotine or tobacco, such as cigarettes, e-cigarettes, and chewing tobacco. If you need help quitting, ask your health care provider.  Do not use street drugs.  Do not share needles.  Ask your health care provider for help if you need support or information about quitting drugs. Alcohol use  Do not drink alcohol if: ? Your health care provider tells you not to drink. ? You are pregnant, may be pregnant, or are planning to become pregnant.  If you drink alcohol: ? Limit how much you use to 0-1 drink a day. ? Limit intake if you are breastfeeding.  Be aware of how much alcohol is in your drink. In the [place] one drink equals one 12 oz bottle of beer (355 mL), one 5 oz glass of wine (148 mL), or one 1 oz glass of hard liquor (44 mL). General instructions  Schedule regular health, dental, and eye exams.  Stay current with your vaccines.  Tell your health care provider if: ? You often feel depressed. ? You have ever been abused or do not feel safe at home. Summary  Adopting a healthy lifestyle and getting preventive care are important in promoting health and wellness.  Follow your health care provider's instructions about healthy  diet, exercising, and getting tested or screened for diseases.  Follow your health care provider's instructions on monitoring your cholesterol and blood pressure. This information is not intended to replace advice given to you by your health care provider. Make sure you discuss any questions you have with your health care provider. Document Revised: 04/19/2018 Document Reviewed: 04/19/2018 Elsevier Patient Education  2020 Elsevier Inc.  

## 2020-04-18 NOTE — Telephone Encounter (Signed)
Spoke with [facility] and no available appts until [facility] 2022.  Call placed to [facility], Spoke with [place]. Pt scheduled on 12/21 at 1pm at [facility].   Call placed to pt. Spoke with pt. Pt given update on appt at [facility].  Pt states unable to keep that scheduled appt due to out of town. Pt advised to call TBC to reschedule. Pt given number. Pt thankful for appt and callback.   Routing to [place], NP for review Orders placed by [facility] for co-sign  Encounter closed

## 2020-04-18 NOTE — Telephone Encounter (Signed)
-----   Message from Karma Ganja, NP sent at 04/18/2020  1:46 PM EST ----- This patient had a breast US (left) one year ago and had a fibroadenoma. They recommended a breast US for follow up. She lives in [place] but will be home Dec 27th. Do you think we can get her scheduled for a left breast US that day at the [facility]. Pt is here now.

## 2020-04-19 LAB — VAGINITIS/VAGINOSIS, DNA PROBE
Candida Species: NEGATIVE
Gardnerella vaginalis: NEGATIVE
Trichomonas vaginosis: NEGATIVE

## 2020-04-19 LAB — GC/CHLAMYDIA PROBE AMP
Chlamydia trachomatis, NAA: NEGATIVE
Neisseria Gonorrhoeae by PCR: NEGATIVE

## 2020-04-19 LAB — HIV ANTIBODY (ROUTINE TESTING W REFLEX): HIV Screen 4th Generation wRfx: NONREACTIVE

## 2020-04-19 LAB — RPR: RPR Ser Ql: NONREACTIVE

## 2020-04-29 ENCOUNTER — Other Ambulatory Visit: Payer: 59

## 2021-05-22 ENCOUNTER — Other Ambulatory Visit: Payer: Self-pay

## 2021-05-22 ENCOUNTER — Ambulatory Visit (INDEPENDENT_AMBULATORY_CARE_PROVIDER_SITE_OTHER): Payer: No Typology Code available for payment source | Admitting: Obstetrics & Gynecology

## 2021-05-22 ENCOUNTER — Encounter: Payer: Self-pay | Admitting: Obstetrics & Gynecology

## 2021-05-22 ENCOUNTER — Other Ambulatory Visit (HOSPITAL_COMMUNITY)
Admission: RE | Admit: 2021-05-22 | Discharge: 2021-05-22 | Disposition: A | Discharge status: Home / Self Care | Payer: No Typology Code available for payment source | Source: Ambulatory Visit | Attending: Obstetrics & Gynecology | Admitting: Obstetrics & Gynecology

## 2021-05-22 VITALS — BP 116/72 | Ht 68.0 in | Wt 154.0 lb

## 2021-05-22 DIAGNOSIS — Z01419 Encounter for gynecological examination (general) (routine) without abnormal findings: Secondary | ICD-10-CM

## 2021-05-22 DIAGNOSIS — Z113 Encounter for screening for infections with a predominantly sexual mode of transmission: Secondary | ICD-10-CM

## 2021-05-22 DIAGNOSIS — Z3009 Encounter for other general counseling and advice on contraception: Secondary | ICD-10-CM

## 2021-05-22 NOTE — Progress Notes (Signed)
Suzanne Greene [birth year] [id number]   History:    26 y.o. G0 Lives in [place]  RP:  Established patient presenting for annual gyn exam   HPI: Stopped BCPs because had to order them through mail delivery and would prefer a long term contraceptive.  Menses regular normal every month.  No BTB.  LMP 3 weeks ago.  Breasts normal.  H/O Left breast 1 cm Fibroadenoma.  Urine/BMs normal.  Good fitness.  BMI 23.42.  Past medical history,surgical history, family history and social history were all reviewed and documented in the EPIC chart.  Gynecologic History Patient's last menstrual period was 04/26/2021.  Obstetric History OB History  Gravida Para Term Preterm AB Living  0 0 0 0 0 0  SAB IAB Ectopic Multiple Live Births  0 0 0 0 0     ROS: A ROS was performed and pertinent positives and negatives are included in the history.  GENERAL: No fevers or chills. HEENT: No change in vision, no earache, sore throat or sinus congestion. NECK: No pain or stiffness. CARDIOVASCULAR: No chest pain or pressure. No palpitations. PULMONARY: No shortness of breath, cough or wheeze. GASTROINTESTINAL: No abdominal pain, nausea, vomiting or diarrhea, melena or bright red blood per rectum. GENITOURINARY: No urinary frequency, urgency, hesitancy or dysuria. MUSCULOSKELETAL: No joint or muscle pain, no back pain, no recent trauma. DERMATOLOGIC: No rash, no itching, no lesions. ENDOCRINE: No polyuria, polydipsia, no heat or cold intolerance. No recent change in weight. HEMATOLOGICAL: No anemia or easy bruising or bleeding. NEUROLOGIC: No headache, seizures, numbness, tingling or weakness. PSYCHIATRIC: No depression, no loss of interest in normal activity or change in sleep pattern.     Exam:   BP 116/72    Ht 5\' 8"  (1.727 m)    Wt 154 lb (69.9 kg)    LMP 04/26/2021    BMI 23.42 kg/m   Body mass index is 23.42 kg/m.  General appearance : Well developed well nourished female. No acute distress HEENT:  Eyes: no retinal hemorrhage or exudates,  Neck supple, trachea midline, no carotid bruits, no thyroidmegaly Lungs: Clear to auscultation, no rhonchi or wheezes, or rib retractions  Heart: Regular rate and rhythm, no murmurs or gallops Breast:Examined in sitting and supine position were symmetrical in appearance, no palpable masses or tenderness,  no skin retraction, no nipple inversion, no nipple discharge, no skin discoloration, no axillary or supraclavicular lymphadenopathy Abdomen: no palpable masses or tenderness, no rebound or guarding Extremities: no edema or skin discoloration or tenderness  Pelvic: Vulva: Normal             Vagina: No gross lesions or discharge  Cervix: No gross lesions or discharge.  Pap reflex/Gono-Chlam done.  Uterus  AV, normal size, shape and consistency, non-tender and mobile  Adnexa  Without masses or tenderness  Anus: Normal   Assessment/Plan:  26 y.o. female for annual exam   1. Encounter for routine gynecological examination with Papanicolaou smear of cervix Stopped BCPs because had to order them through mail delivery and would prefer a long term contraceptive.  Menses regular normal every month.  No BTB.  LMP 3 weeks ago.  No pelvic pain.  No pain with IC.  Pap reflex done. Breasts normal.  H/O Left breast 1 cm Fibroadenoma.  Urine/BMs normal.  Good fitness.  BMI 23.42. - Cytology - PAP( [place])  2. Encounter for counseling regarding contraception Counseling on long term contraception.  Nexplanon, Copper IUD and Progesterone IUD thoroughly reviewed  with patient.  Decision to proceed with a Progesterone IUD, probably Skyla. - IUD Insertion; Future  3. Screen for STD (sexually transmitted disease) - Cytology - PAP( [place]) - Gono-Chlam done.  [name] MD, 11:41 AM 05/22/2021

## 2021-05-24 ENCOUNTER — Encounter: Payer: Self-pay | Admitting: Obstetrics & Gynecology

## 2021-05-25 LAB — CYTOLOGY - PAP
Chlamydia: NEGATIVE
Comment: NEGATIVE
Comment: NORMAL
Diagnosis: NEGATIVE
Neisseria Gonorrhea: NEGATIVE

## 2021-07-10 ENCOUNTER — Ambulatory Visit: Payer: No Typology Code available for payment source | Admitting: Obstetrics & Gynecology

## 2021-08-20 ENCOUNTER — Ambulatory Visit: Payer: No Typology Code available for payment source | Admitting: Obstetrics & Gynecology

## 2021-09-02 ENCOUNTER — Ambulatory Visit: Payer: No Typology Code available for payment source | Admitting: Obstetrics & Gynecology

## 2021-09-18 ENCOUNTER — Telehealth: Payer: Self-pay | Admitting: *Deleted

## 2021-09-18 NOTE — Telephone Encounter (Signed)
Message sent to appointment desk.  ?

## 2021-09-18 NOTE — Telephone Encounter (Signed)
Please see below message from appointment desk  ?

## 2021-09-18 NOTE — Telephone Encounter (Signed)
-----   Message from [facility] sent at 09/18/2021 11:20 AM EDT ----- ?Suzanne Greene, pt called requesting to schedule IUD insertion since she started her period today.  ML does not have any soon availability and she states she has irregular menses.  Please advise if ok to schedule with other provider/NP.  ? ?

## 2021-09-21 NOTE — Telephone Encounter (Signed)
Patient scheduled on 09/23/21 ?

## 2021-09-22 NOTE — Telephone Encounter (Signed)
Patient called back because she was cancelled for 09/23/21 per appointment note " no period". I called patient and she was not aware she was scheduled on 09/23/21. Her LMP:09/18/21 and reports her cycles can be 3 days at times, she is sexually active and using condoms. I explained to patient Dr.Lavoie was okay with her having IUD on Wednesday per ML response "Just schedule with me on Wednesday.  Use condoms.  Will do UPT.  " ? ?Message sent back to appointments to schedule.  ?

## 2021-09-23 ENCOUNTER — Encounter: Payer: Self-pay | Admitting: Obstetrics & Gynecology

## 2021-09-23 ENCOUNTER — Ambulatory Visit: Payer: No Typology Code available for payment source | Admitting: Obstetrics & Gynecology

## 2021-09-23 ENCOUNTER — Ambulatory Visit (INDEPENDENT_AMBULATORY_CARE_PROVIDER_SITE_OTHER): Payer: No Typology Code available for payment source | Admitting: Obstetrics & Gynecology

## 2021-09-23 VITALS — BP 110/70

## 2021-09-23 DIAGNOSIS — Z01812 Encounter for preprocedural laboratory examination: Secondary | ICD-10-CM

## 2021-09-23 DIAGNOSIS — Z30430 Encounter for insertion of intrauterine contraceptive device: Secondary | ICD-10-CM

## 2021-09-23 DIAGNOSIS — Z3009 Encounter for other general counseling and advice on contraception: Secondary | ICD-10-CM

## 2021-09-23 LAB — PREGNANCY, URINE: Preg Test, Ur: NEGATIVE

## 2021-09-23 NOTE — Progress Notes (Signed)
    Suzanne Greene 06-01-1995 [id number]        26 y.o.  G0  RP: Suzanne Greene IUD insertion  HPI: Desires Suzanne Greene IUD for contraception.  LMP normal 09/18/2021.  No pelvic pain.  Normal vaginal secretions.  Gono-Chlam Neg in 05/2021, declines repeating.   OB History  Gravida Para Term Preterm AB Living  0 0 0 0 0 0  SAB IAB Ectopic Multiple Live Births  0 0 0 0 0    Past medical history,surgical history, problem list, medications, allergies, family history and social history were all reviewed and documented in the EPIC chart.   Directed ROS with pertinent positives and negatives documented in the history of present illness/assessment and plan.  Exam:  Vitals:   09/23/21 1533  BP: 110/70   General appearance:  Normal                                                                    IUD procedure note       Patient presented to the office today for placement of Suzanne Greene IUD. The patient had previously been provided with literature information on this method of contraception. The risks benefits and pros and cons were discussed and all her questions were answered. She is fully aware that this form of contraception is 99% effective and is good for 3 years.  Pelvic exam: Vulva normal Vagina: No lesions or discharge Cervix: No lesions or discharge Uterus: AV position Adnexa: No masses or tenderness Rectal exam: Not done  The cervix was cleansed with Betadine solution. [place] spray on the cervix.  A single-tooth tenaculum was placed on the anterior cervical lip.  Os finder hysterometry at 7 cm.  The IUD was shown to the patient and inserted in a sterile fashion.  The IUD string was trimmed. The single-tooth tenaculum was removed. Patient was instructed to return back to the office in one month for follow up.           UPT Neg                                                                 Assessment/Plan:  26 y.o. G0P0000   1. Encounter for IUD insertion Desires Suzanne Greene IUD for  contraception.  LMP normal 09/18/2021.  No pelvic pain.  Normal vaginal secretions.  Gono-Chlam Neg in 05/2021, declines repeating.  UPT Neg.  Easy insertion of Suzanne Greene IUD.  No Cx and well tolerated by patient.  Post procedure precautions.  F/U 4 weeks for IUD check.  2. Encounter for preprocedural laboratory examination UPT Neg - Pregnancy, urine  3. Encounter for counseling regarding contraception - IUD Insertion  Other orders - ampicillin (PRINCIPEN) 500 MG capsule; Take 500 mg by mouth 2 (two) times daily. - spironolactone (ALDACTONE) 50 MG tablet; Take 50 mg by mouth 2 (two) times daily.   [name] MD, 4:31 PM 09/23/2021

## 2021-09-30 ENCOUNTER — Encounter: Payer: Self-pay | Admitting: Obstetrics & Gynecology

## 2021-10-30 ENCOUNTER — Ambulatory Visit: Payer: No Typology Code available for payment source | Admitting: Obstetrics & Gynecology

## 2021-11-04 ENCOUNTER — Encounter: Payer: Self-pay | Admitting: Obstetrics & Gynecology

## 2021-11-04 ENCOUNTER — Ambulatory Visit (INDEPENDENT_AMBULATORY_CARE_PROVIDER_SITE_OTHER): Payer: No Typology Code available for payment source | Admitting: Obstetrics & Gynecology

## 2021-11-04 VITALS — BP 120/64

## 2021-11-04 DIAGNOSIS — Z30431 Encounter for routine checking of intrauterine contraceptive device: Secondary | ICD-10-CM | POA: Diagnosis not present

## 2021-11-04 NOTE — Progress Notes (Signed)
    Angella Montas [birth year] [id number]        26 y.o.  G0  RP: Skyla IUD check post insertion on 09/23/21  HPI: Well with Skyla IUD.  Probably had her menses 2 weeks ago.  Mild vaginal secretions.  No pelvic pain.  No pain with IC.  No fever.   OB History  Gravida Para Term Preterm AB Living  0 0 0 0 0 0  SAB IAB Ectopic Multiple Live Births  0 0 0 0 0    Past medical history,surgical history, problem list, medications, allergies, family history and social history were all reviewed and documented in the EPIC chart.   Directed ROS with pertinent positives and negatives documented in the history of present illness/assessment and plan.  Exam:  Vitals:   11/04/21 1134  BP: 120/64   General appearance:  Normal  Abdomen: Normal  Gynecologic exam: Vulva normal.  Speculum:  Cervix normal, no erythema.  IUD strings visible at the EO.  Normal secretions, no blood.  Vagina normal.   Assessment/Plan:  26 y.o. G0   1. Encounter for routine checking of intrauterine contraceptive device (IUD)  Well with Skyla IUD.  Probably had her menses 2 weeks ago.  Mild vaginal secretions.  No pelvic pain.  No pain with IC.  No fever. On gyn exam, Isla Pence IUD is in good position with IUD strings visible at the EO and no sign of infection.  Patient reassured.  F/U Gyn health exam in 05/2022.   [name] MD, 11:43 AM 11/04/2021

## 2022-05-28 ENCOUNTER — Encounter: Payer: Self-pay | Admitting: Obstetrics & Gynecology

## 2022-05-28 ENCOUNTER — Ambulatory Visit (INDEPENDENT_AMBULATORY_CARE_PROVIDER_SITE_OTHER): Payer: No Typology Code available for payment source | Admitting: Obstetrics & Gynecology

## 2022-05-28 ENCOUNTER — Other Ambulatory Visit (HOSPITAL_COMMUNITY)
Admission: RE | Admit: 2022-05-28 | Discharge: 2022-05-28 | Disposition: A | Discharge status: Home / Self Care | Payer: No Typology Code available for payment source | Source: Ambulatory Visit | Attending: Obstetrics & Gynecology | Admitting: Obstetrics & Gynecology

## 2022-05-28 VITALS — BP 118/76 | HR 72 | Resp 16 | Ht 68.25 in | Wt 159.0 lb

## 2022-05-28 DIAGNOSIS — Z113 Encounter for screening for infections with a predominantly sexual mode of transmission: Secondary | ICD-10-CM | POA: Diagnosis not present

## 2022-05-28 DIAGNOSIS — Z01419 Encounter for gynecological examination (general) (routine) without abnormal findings: Secondary | ICD-10-CM | POA: Diagnosis not present

## 2022-05-28 DIAGNOSIS — Z30431 Encounter for routine checking of intrauterine contraceptive device: Secondary | ICD-10-CM

## 2022-05-28 NOTE — Progress Notes (Signed)
Suzanne Greene [birth year] [id number]   History:    27 y.o.  G0 Lives in [place]   RP:  Established patient presenting for annual gyn exam    HPI: Well on [place] IUD x 09/23/21.  Light spaced menses. No pelvic pain.  Pap Neg 05/2021. Pap reflex/Gono-Chlam today.Breasts normal.  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 Urine/BMs normal.  Good fitness.  BMI 24.0.   Past medical history,surgical history, family history and social history were all reviewed and documented in the EPIC chart.  Gynecologic History No LMP recorded. (Menstrual status: IUD).  Obstetric History OB History  Gravida Para Term Preterm AB Living  0 0 0 0 0 0  SAB IAB Ectopic Multiple Live Births  0 0 0 0 0     ROS: A ROS was performed and pertinent positives and negatives are included in the history. GENERAL: No fevers or chills. HEENT: No change in vision, no earache, sore throat or sinus congestion. NECK: No pain or stiffness. CARDIOVASCULAR: No chest pain or pressure. No palpitations. PULMONARY: No shortness of breath, cough or wheeze. GASTROINTESTINAL: No abdominal pain, nausea, vomiting or diarrhea, melena or bright red blood per rectum. GENITOURINARY: No urinary frequency, urgency, hesitancy or dysuria. MUSCULOSKELETAL: No joint or muscle pain, no back pain, no recent trauma. DERMATOLOGIC: No rash, no itching, no lesions. ENDOCRINE: No polyuria, polydipsia, no heat or cold intolerance. No recent change in weight. HEMATOLOGICAL: No anemia or easy bruising or bleeding. NEUROLOGIC: No headache, seizures, numbness, tingling or weakness. PSYCHIATRIC: No depression, no loss of interest in normal activity or change in sleep pattern.     Exam:   BP 118/76   Pulse 72   Resp 16   Ht 5' 8.25" (1.734 m)   Wt 159 lb (72.1 kg)   BMI 24.00 kg/m   Body mass index is 24 kg/m.  General appearance : Well developed well nourished female. No acute distress HEENT: Eyes: no retinal hemorrhage or exudates,  Neck  supple, trachea midline, no carotid bruits, no thyroidmegaly Lungs: Clear to auscultation, no rhonchi or wheezes, or rib retractions  Heart: Regular rate and rhythm, no murmurs or gallops Breast:Examined in sitting and supine position were symmetrical in appearance, no palpable masses or tenderness,  no skin retraction, no nipple inversion, no nipple discharge, no skin discoloration, no axillary or supraclavicular lymphadenopathy Abdomen: no palpable masses or tenderness, no rebound or guarding Extremities: no edema or skin discoloration or tenderness  Pelvic: Vulva: Normal             Vagina: No gross lesions or discharge  Cervix: No gross lesions or discharge.  Strings visible at EO.  Pap reflex/Gono-Chlam done.  Uterus  AV, normal size, shape and consistency, non-tender and mobile  Adnexa  Without masses or tenderness  Anus: Normal   Assessment/Plan:  27 y.o. female for annual exam   1. Encounter for routine gynecological examination with Papanicolaou smear of cervix Well on Skyla IUD x 09/23/21.  Light spaced menses. No pelvic pain.  Pap Neg 05/2021. Pap reflex/Gono-Chlam today.Breasts normal.  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 Urine/BMs normal.  Good fitness.  BMI 24.0. - Cytology - PAP( [place])  2. Encounter for routine checking of intrauterine contraceptive device (IUD) Well on Skyla IUD x 09/23/21.  Light spaced menses. No pelvic pain. IUD in good position.  3. Screen for STD (sexually transmitted disease) - Cytology - PAP( [place]) Gono-Chlam  Other orders - Levonorgestrel (SKYLA) 13.5 MG IUD;  by Intrauterine route.   [name] MD, 2:07 PM

## 2022-06-03 LAB — CYTOLOGY - PAP
Chlamydia: NEGATIVE
Comment: NEGATIVE
Comment: NORMAL
Diagnosis: NEGATIVE
Neisseria Gonorrhea: NEGATIVE
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
## 2022-12-08 ENCOUNTER — Encounter: Payer: Self-pay | Admitting: Obstetrics and Gynecology

## 2022-12-08 ENCOUNTER — Ambulatory Visit (INDEPENDENT_AMBULATORY_CARE_PROVIDER_SITE_OTHER): Payer: No Typology Code available for payment source | Admitting: Obstetrics and Gynecology

## 2022-12-08 VITALS — BP 122/80 | HR 50 | Ht 68.25 in | Wt 159.0 lb

## 2022-12-08 DIAGNOSIS — D229 Melanocytic nevi, unspecified: Secondary | ICD-10-CM | POA: Diagnosis not present

## 2023-06-01 ENCOUNTER — Encounter: Payer: Self-pay | Admitting: Obstetrics and Gynecology

## 2023-06-01 ENCOUNTER — Ambulatory Visit (INDEPENDENT_AMBULATORY_CARE_PROVIDER_SITE_OTHER): Payer: No Typology Code available for payment source | Admitting: Obstetrics and Gynecology

## 2023-06-01 VITALS — BP 154/68 | HR 63 | Temp 97.6°F | Ht 68.5 in | Wt 159.0 lb

## 2023-06-01 DIAGNOSIS — Z113 Encounter for screening for infections with a predominantly sexual mode of transmission: Secondary | ICD-10-CM | POA: Diagnosis not present

## 2023-06-01 DIAGNOSIS — Z01419 Encounter for gynecological examination (general) (routine) without abnormal findings: Secondary | ICD-10-CM | POA: Insufficient documentation

## 2023-06-01 DIAGNOSIS — Z975 Presence of (intrauterine) contraceptive device: Secondary | ICD-10-CM | POA: Diagnosis not present

## 2023-06-01 DIAGNOSIS — Z803 Family history of malignant neoplasm of breast: Secondary | ICD-10-CM | POA: Insufficient documentation

## 2023-06-01 NOTE — Patient Instructions (Signed)
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
## 2023-06-01 NOTE — Assessment & Plan Note (Signed)
 Cervical cancer screening performed according to ASCCP guidelines. Labs and immunizations with her primary Encouraged safe sexual practices as indicated Encouraged healthy lifestyle practices with diet and exercise For patients under 30yo, I recommend 1000mg  calcium daily and 600IU of vitamin D daily.

## 2023-06-01 NOTE — Assessment & Plan Note (Addendum)
Suzanne Greene due for removal in 2026.

## 2023-06-01 NOTE — Assessment & Plan Note (Signed)
Recommend genetics referral due to paternal maternal family history of breast cancer.

## 2023-06-02 LAB — COMPLETE METABOLIC PANEL WITH GFR
AG Ratio: 2.2 (calc) (ref 1.0–2.5)
ALT: 21 U/L (ref 6–29)
AST: 21 U/L (ref 10–30)
Albumin: 5.3 g/dL — ABNORMAL HIGH (ref 3.6–5.1)
Alkaline phosphatase (APISO): 60 U/L (ref 31–125)
BUN: 15 mg/dL (ref 7–25)
CO2: 30 mmol/L (ref 20–32)
Calcium: 9.9 mg/dL (ref 8.6–10.2)
Chloride: 98 mmol/L (ref 98–110)
Creat: 0.9 mg/dL (ref 0.50–0.96)
Globulin: 2.4 g/dL (ref 1.9–3.7)
Glucose, Bld: 55 mg/dL — ABNORMAL LOW (ref 65–99)
Potassium: 3.7 mmol/L (ref 3.5–5.3)
Sodium: 139 mmol/L (ref 135–146)
Total Bilirubin: 0.6 mg/dL (ref 0.2–1.2)
Total Protein: 7.7 g/dL (ref 6.1–8.1)
eGFR: 89 mL/min/{1.73_m2} (ref >=60)

## 2023-06-02 LAB — CBC
HCT: 44.7 % (ref 35.0–45.0)
Hemoglobin: 14.6 g/dL (ref 11.7–15.5)
MCH: 29.7 pg (ref 27.0–33.0)
MCHC: 32.7 g/dL (ref 32.0–36.0)
MCV: 90.9 fL (ref 80.0–100.0)
MPV: 10.7 fL (ref 7.5–12.5)
Platelets: 295 10*3/uL (ref 140–400)
RBC: 4.92 10*6/uL (ref 3.80–5.10)
RDW: 11.8 % (ref 11.0–15.0)
WBC: 7.5 10*3/uL (ref 3.8–10.8)

## 2023-06-02 LAB — SURESWAB CT/NG/T. VAGINALIS
C. trachomatis RNA, TMA: NOT DETECTED
N. gonorrhoeae RNA, TMA: NOT DETECTED
Trichomonas vaginalis RNA: NOT DETECTED

## 2023-06-02 LAB — HIV ANTIBODY (ROUTINE TESTING W REFLEX): HIV 1&2 Ab, 4th Generation: NONREACTIVE

## 2023-06-02 LAB — LIPID PANEL
Cholesterol: 178 mg/dL (ref <200)
HDL: 60 mg/dL (ref >=50)
LDL Cholesterol (Calc): 97 mg/dL
Non-HDL Cholesterol (Calc): 118 mg/dL (ref <130)
Total CHOL/HDL Ratio: 3 (calc) (ref <5.0)
Triglycerides: 116 mg/dL (ref <150)

## 2023-06-02 LAB — VITAMIN D 25 HYDROXY (VIT D DEFICIENCY, FRACTURES): Vit D, 25-Hydroxy: 42 ng/mL (ref 30–100)

## 2023-06-02 LAB — HEPATITIS C ANTIBODY: Hepatitis C Ab: NONREACTIVE

## 2023-06-02 LAB — RPR: RPR Ser Ql: NONREACTIVE

## 2023-06-02 LAB — HEPATITIS B SURFACE ANTIGEN: Hepatitis B Surface Ag: NONREACTIVE

## 2023-06-03 ENCOUNTER — Encounter: Payer: Self-pay | Admitting: Obstetrics and Gynecology
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
## 2023-09-05 ENCOUNTER — Inpatient Hospital Stay: Payer: No Typology Code available for payment source

## 2023-09-05 ENCOUNTER — Telehealth: Payer: Self-pay | Admitting: Genetic Counselor

## 2023-09-05 ENCOUNTER — Inpatient Hospital Stay: Payer: No Typology Code available for payment source | Admitting: Genetic Counselor

## 2023-09-05 NOTE — Telephone Encounter (Signed)
 Patient called because she didn't recall scheduling a genetic counseling appt today.  Explained that she was referred by Dr Andrena Ke for FH breast cancer.  Explained process of genetic counseling.  Patient opted to switch to telephone visit as she currently lives in [place].  Rescheduled for 6/12 at 2pm w/ Toy Freund.

## 2023-10-20 ENCOUNTER — Inpatient Hospital Stay: Attending: Genetic Counselor | Admitting: Genetic Counselor

## 2023-10-20 DIAGNOSIS — Z803 Family history of malignant neoplasm of breast: Secondary | ICD-10-CM

## 2023-10-24 ENCOUNTER — Encounter: Payer: Self-pay | Admitting: Genetic Counselor
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
## 2023-11-25 ENCOUNTER — Encounter: Payer: Self-pay | Admitting: Genetic Counselor

## 2023-11-25 ENCOUNTER — Telehealth: Payer: Self-pay | Admitting: Genetic Counselor

## 2023-11-25 DIAGNOSIS — Z1379 Encounter for other screening for genetic and chromosomal anomalies: Secondary | ICD-10-CM | POA: Insufficient documentation

## 2023-11-25 NOTE — Telephone Encounter (Signed)
 I contacted Ms. Smithhart to discuss her genetic testing results. No pathogenic variants were identified in the 44 genes analyzed. Of note, a variant of uncertain significance was identified in the ATM gene. Detailed clinic note to follow.  The test report has been scanned into EPIC and is located under the Molecular Pathology section of the Results Review tab.  A portion of the result report is included below for reference.   Vianey Caniglia, MS, [facility] Genetic Counselor [email or web address] (P) [phone]

## 2023-12-06 ENCOUNTER — Ambulatory Visit: Payer: Self-pay | Admitting: Genetic Counselor

## 2023-12-06 DIAGNOSIS — Z1379 Encounter for other screening for genetic and chromosomal anomalies: Secondary | ICD-10-CM

## 2023-12-06 NOTE — Progress Notes (Signed)
 HPI:   Ms. Mousseau was previously seen in the  Cancer Genetics clinic due to a family history of cancer and concerns regarding a hereditary predisposition to cancer. Please refer to our prior cancer genetics clinic note for more information regarding our discussion, assessment and recommendations, at the time. Ms. Franzen recent genetic test results were disclosed to her, as were recommendations warranted by these results. These results and recommendations are discussed in more detail below.  FAMILY HISTORY:  We obtained a detailed, 4-generation family history.  Significant diagnoses are listed below:      Family History  Problem Relation Age of Onset   Hypertension Father     Breast cancer Maternal Aunt 41   Breast cancer Paternal Aunt 70   Breast cancer Maternal Grandmother 77   Cancer Paternal Grandmother          unknown type   Melanoma Paternal Grandfather 80           Ms. Struss is unaware of previous family history of genetic testing for hereditary cancer risks. There is no reported Ashkenazi Jewish ancestry.  GENETIC TEST RESULTS:  The [employer] CustomNext Panel found no pathogenic mutations.    [employer] CustomNext Panel (CancerNext+ melanoma genes): APC, ATM, AXIN2, BAP1, BARD1, BMPR1A, BRCA1, BRCA2, BRIP1, CDH1, CDK4, CDKN2A, CHEK2, EPCAM, FH, FLCN, GREM1, HOXB13, MBD4, MET, MITF, MLH1, MSH2, MSH3, MSH6, MUTYH, NF1, NTHL1, PALB2, PMS2, POLD1, POLE, POT1, PTEN, RAD51C, RAD51D, RB1, RPS20, SMAD4, STK11, TP53, TSC1, TSC2, VHL    The test report has been scanned into EPIC and is located under the Molecular Pathology section of the Results Review tab.  A portion of the result report is included below for reference. Genetic testing reported out on 11/24/2023.       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  At this time, it is unknown if this variant is associated with an increased risk for cancer or if it is benign, but  most uncertain variants are reclassified to benign. It should not be used to make medical management decisions. With time, we suspect the laboratory will determine the significance of this variant, if any. If the laboratory reclassifies this variant, we will attempt to contact Ms. Colon to discuss it further.   Even though a pathogenic variant was not identified, possible explanations for the cancer in the family may include: There may be no hereditary risk for cancer in the family. The cancers in her family may be due to other genetic or environmental factors. There may be a gene mutation in one of these genes that current testing methods cannot detect, but that chance is small. There could be another gene that has not yet been discovered, or that we have not yet tested, that is responsible for the cancer diagnoses in the family.  It is also possible there is a hereditary cause for the cancer in the family that Ms. Zellars did not inherit.  Therefore, it is important to remain in touch with cancer genetics in the future so that we can continue to offer Ms. Fennema the most up to date genetic testing.   ADDITIONAL GENETIC TESTING:  We discussed with Ms. Stiger that her genetic testing was fairly extensive.  If there are genes identified to increase cancer risk that can be analyzed in the future, we would be happy to discuss and coordinate this testing at that time.    CANCER SCREENING RECOMMENDATIONS:  Ms. Nannini test result is  considered negative (normal).  This means that we have not identified a hereditary cause for her family history of cancer at this time.    An individual's cancer risk and medical management are not determined by genetic test results alone. Overall cancer risk assessment incorporates additional factors, including personal medical history, family history, and any available genetic information that may result in a personalized plan for cancer prevention and surveillance. Therefore,  it is recommended she continue to follow the cancer management and screening guidelines provided by her primary healthcare provider.  Ms. Fuquay may still be at an increased risk for developing breast cancer due to her family history of breast cancer. She is recommended to begin annual mammograms at age 51, or 79 years younger than the earliest onset of breast cancer in the family, whichever is first. At that time, a Tyrer-Cuzick risk model should be performed to determine if she needs high risk breast cancer screening.  RECOMMENDATIONS FOR FAMILY MEMBERS:   Other members of the family may still carry a pathogenic variant in one of these genes that Ms. Topham did not inherit. Based on the family history, we recommend her maternal aunt and maternal grandmother consider genetic counseling and testing.   FOLLOW-UP:  Cancer genetics is a rapidly advancing field and it is possible that new genetic tests will be appropriate for her and/or her family members in the future. We encouraged her to remain in contact with cancer genetics on an annual basis so we can update her personal and family histories and let her know of advances in cancer genetics that may benefit this family.   Our contact number was provided. Ms. Kiedrowski questions were answered to her satisfaction, and she knows she is welcome to call us  at anytime with additional questions or concerns.   Britney Newstrom, MS, [facility] Genetic Counselor [email or web address] (P) [phone]

## 2024-06-06 ENCOUNTER — Encounter: Payer: Self-pay | Admitting: Obstetrics and Gynecology

## 2024-06-06 ENCOUNTER — Ambulatory Visit: Payer: No Typology Code available for payment source | Admitting: Obstetrics and Gynecology

## 2024-06-06 VITALS — BP 132/76 | HR 65 | Temp 97.8°F | Ht 69.0 in | Wt 157.0 lb

## 2024-06-06 DIAGNOSIS — Z1331 Encounter for screening for depression: Secondary | ICD-10-CM | POA: Diagnosis not present

## 2024-06-06 DIAGNOSIS — Z01419 Encounter for gynecological examination (general) (routine) without abnormal findings: Secondary | ICD-10-CM | POA: Diagnosis not present

## 2024-06-06 DIAGNOSIS — N9089 Other specified noninflammatory disorders of vulva and perineum: Secondary | ICD-10-CM | POA: Diagnosis not present

## 2024-06-06 DIAGNOSIS — N898 Other specified noninflammatory disorders of vagina: Secondary | ICD-10-CM

## 2024-06-06 DIAGNOSIS — Z975 Presence of (intrauterine) contraceptive device: Secondary | ICD-10-CM
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
## 2024-06-06 NOTE — Patient Instructions (Signed)

## 2024-06-06 NOTE — Assessment & Plan Note (Signed)
 Cervical cancer screening performed according to ASCCP guidelines. Labs and immunizations with her primary Encouraged safe sexual practices as indicated Encouraged healthy lifestyle practices with diet and exercise For patients under 31yo, I recommend 1000mg  calcium daily and 600IU of vitamin D daily.

## 2024-06-07 LAB — SURESWAB® ADVANCED VAGINITIS PLUS,TMA
C. trachomatis RNA, TMA: NOT DETECTED
CANDIDA SPECIES: NOT DETECTED
Candida glabrata: NOT DETECTED
N. gonorrhoeae RNA, TMA: NOT DETECTED
SURESWAB(R) ADV BACTERIAL VAGINOSIS(BV),TMA: NEGATIVE
TRICHOMONAS VAGINALIS (TV),TMA: NOT DETECTED

## 2024-06-08 ENCOUNTER — Ambulatory Visit: Payer: Self-pay | Admitting: Obstetrics and Gynecology

## 2024-10-03 ENCOUNTER — Ambulatory Visit: Admitting: Obstetrics and Gynecology
